# Patient Record
Sex: Female | Born: 1985 | Race: White | Hispanic: No | Marital: Single | Smoking: Former smoker
Health system: Southern US, Community
[De-identification: ages and names within clinical notes are randomized; demographics above are authoritative.]

## PROBLEM LIST (undated history)

## (undated) DIAGNOSIS — J449 Chronic obstructive pulmonary disease, unspecified: Secondary | ICD-10-CM

## (undated) DIAGNOSIS — D649 Anemia, unspecified: Secondary | ICD-10-CM

## (undated) DIAGNOSIS — J4489 Other specified chronic obstructive pulmonary disease: Secondary | ICD-10-CM

## (undated) DIAGNOSIS — F419 Anxiety disorder, unspecified: Secondary | ICD-10-CM

## (undated) HISTORY — PX: TOOTH EXTRACTION: SUR596

---

## 2007-02-14 ENCOUNTER — Ambulatory Visit (HOSPITAL_COMMUNITY): Admission: RE | Admit: 2007-02-14 | Discharge: 2007-02-14 | Payer: Self-pay | Admitting: Obstetrics and Gynecology

## 2007-03-07 ENCOUNTER — Ambulatory Visit (HOSPITAL_COMMUNITY): Admission: RE | Admit: 2007-03-07 | Discharge: 2007-03-07 | Payer: Self-pay | Admitting: Obstetrics and Gynecology

## 2008-12-13 ENCOUNTER — Emergency Department (HOSPITAL_BASED_OUTPATIENT_CLINIC_OR_DEPARTMENT_OTHER): Admission: EM | Admit: 2008-12-13 | Discharge: 2008-12-13 | Payer: Self-pay | Admitting: Emergency Medicine

## 2008-12-27 ENCOUNTER — Emergency Department (HOSPITAL_BASED_OUTPATIENT_CLINIC_OR_DEPARTMENT_OTHER): Admission: EM | Admit: 2008-12-27 | Discharge: 2008-12-27 | Payer: Self-pay | Admitting: Emergency Medicine

## 2009-07-03 ENCOUNTER — Emergency Department (HOSPITAL_BASED_OUTPATIENT_CLINIC_OR_DEPARTMENT_OTHER): Admission: EM | Admit: 2009-07-03 | Discharge: 2009-07-03 | Payer: Self-pay | Admitting: Emergency Medicine

## 2009-07-31 ENCOUNTER — Emergency Department (HOSPITAL_BASED_OUTPATIENT_CLINIC_OR_DEPARTMENT_OTHER): Admission: EM | Admit: 2009-07-31 | Discharge: 2009-07-31 | Payer: Self-pay | Admitting: Emergency Medicine

## 2009-08-04 ENCOUNTER — Emergency Department (HOSPITAL_BASED_OUTPATIENT_CLINIC_OR_DEPARTMENT_OTHER): Admission: EM | Admit: 2009-08-04 | Discharge: 2009-08-04 | Payer: Self-pay | Admitting: Emergency Medicine

## 2009-10-16 ENCOUNTER — Emergency Department (HOSPITAL_BASED_OUTPATIENT_CLINIC_OR_DEPARTMENT_OTHER): Admission: EM | Admit: 2009-10-16 | Discharge: 2009-10-16 | Payer: Self-pay | Admitting: Emergency Medicine

## 2010-07-06 ENCOUNTER — Emergency Department (HOSPITAL_BASED_OUTPATIENT_CLINIC_OR_DEPARTMENT_OTHER): Admission: EM | Admit: 2010-07-06 | Discharge: 2010-07-06 | Payer: Self-pay | Admitting: Emergency Medicine

## 2010-07-06 ENCOUNTER — Ambulatory Visit: Payer: Self-pay | Admitting: Diagnostic Radiology

## 2010-09-29 ENCOUNTER — Emergency Department (HOSPITAL_BASED_OUTPATIENT_CLINIC_OR_DEPARTMENT_OTHER)
Admission: EM | Admit: 2010-09-29 | Discharge: 2010-09-29 | Payer: Self-pay | Source: Home / Self Care | Admitting: Emergency Medicine

## 2010-12-28 LAB — URINALYSIS, ROUTINE W REFLEX MICROSCOPIC
Bilirubin Urine: NEGATIVE
Glucose, UA: NEGATIVE mg/dL
Hgb urine dipstick: NEGATIVE
Specific Gravity, Urine: 1.015 (ref 1.005–1.030)
Urobilinogen, UA: 1 mg/dL (ref 0.0–1.0)
pH: 7.5 (ref 5.0–8.0)

## 2010-12-28 LAB — COMPREHENSIVE METABOLIC PANEL
AST: 27 U/L (ref 0–37)
CO2: 23 mEq/L (ref 19–32)
Calcium: 9 mg/dL (ref 8.4–10.5)
Creatinine, Ser: 0.7 mg/dL (ref 0.4–1.2)
GFR calc Af Amer: >60 mL/min (ref >60)
GFR calc non Af Amer: >60 mL/min (ref >60)
Total Protein: 7.2 g/dL (ref 6.0–8.3)

## 2010-12-28 LAB — URINE MICROSCOPIC-ADD ON

## 2011-01-17 LAB — URINALYSIS, ROUTINE W REFLEX MICROSCOPIC
Nitrite: NEGATIVE
Specific Gravity, Urine: >1.03 — ABNORMAL HIGH (ref 1.005–1.030)
Urobilinogen, UA: 0.2 mg/dL (ref 0.0–1.0)
pH: 6 (ref 5.0–8.0)

## 2011-01-17 LAB — URINE MICROSCOPIC-ADD ON

## 2011-01-17 LAB — PREGNANCY, URINE: Preg Test, Ur: NEGATIVE

## 2011-01-27 LAB — URINALYSIS, ROUTINE W REFLEX MICROSCOPIC
Bilirubin Urine: NEGATIVE
Glucose, UA: NEGATIVE mg/dL
Ketones, ur: NEGATIVE mg/dL
pH: 6.5 (ref 5.0–8.0)

## 2011-01-27 LAB — URINE MICROSCOPIC-ADD ON

## 2011-04-13 ENCOUNTER — Emergency Department (HOSPITAL_COMMUNITY)
Admission: EM | Admit: 2011-04-13 | Discharge: 2011-04-13 | Disposition: A | Discharge status: Home / Self Care | Payer: Self-pay | Attending: Emergency Medicine | Admitting: Emergency Medicine

## 2011-04-13 DIAGNOSIS — F329 Major depressive disorder, single episode, unspecified: Secondary | ICD-10-CM | POA: Insufficient documentation

## 2011-04-13 DIAGNOSIS — F3289 Other specified depressive episodes: Secondary | ICD-10-CM | POA: Insufficient documentation

## 2011-04-13 DIAGNOSIS — F411 Generalized anxiety disorder: Secondary | ICD-10-CM | POA: Insufficient documentation

## 2011-04-13 LAB — DIFFERENTIAL
Basophils Absolute: 0 10*3/uL (ref 0.0–0.1)
Lymphocytes Relative: 19 % (ref 12–46)
Neutro Abs: 8.3 10*3/uL — ABNORMAL HIGH (ref 1.7–7.7)
Neutrophils Relative %: 73 % (ref 43–77)

## 2011-04-13 LAB — CBC
HCT: 41.5 % (ref 36.0–46.0)
Hemoglobin: 14.1 g/dL (ref 12.0–15.0)
RDW: 13.8 % (ref 11.5–15.5)
WBC: 11.4 10*3/uL — ABNORMAL HIGH (ref 4.0–10.5)

## 2011-04-13 LAB — RAPID URINE DRUG SCREEN, HOSP PERFORMED
Amphetamines: POSITIVE — AB
Barbiturates: NOT DETECTED
Benzodiazepines: NOT DETECTED
Tetrahydrocannabinol: POSITIVE — AB

## 2011-04-13 LAB — COMPREHENSIVE METABOLIC PANEL
Albumin: 4.6 g/dL (ref 3.5–5.2)
Alkaline Phosphatase: 64 U/L (ref 39–117)
BUN: 9 mg/dL (ref 6–23)
Chloride: 103 mEq/L (ref 96–112)
Potassium: 3.7 mEq/L (ref 3.5–5.1)
Total Bilirubin: 0.8 mg/dL (ref 0.3–1.2)

## 2011-04-13 LAB — URINALYSIS, ROUTINE W REFLEX MICROSCOPIC
Glucose, UA: NEGATIVE mg/dL
Protein, ur: NEGATIVE mg/dL
Urobilinogen, UA: 1 mg/dL (ref 0.0–1.0)
pH: 7 (ref 5.0–8.0)

## 2011-04-13 LAB — URINE MICROSCOPIC-ADD ON

## 2011-04-13 LAB — ETHANOL: Alcohol, Ethyl (B): <11 mg/dL (ref 0–11)

## 2011-07-20 ENCOUNTER — Encounter: Payer: Self-pay | Admitting: *Deleted

## 2011-07-20 ENCOUNTER — Emergency Department (HOSPITAL_BASED_OUTPATIENT_CLINIC_OR_DEPARTMENT_OTHER)
Admission: EM | Admit: 2011-07-20 | Discharge: 2011-07-20 | Disposition: A | Discharge status: Home / Self Care | Payer: Self-pay | Attending: Emergency Medicine | Admitting: Emergency Medicine

## 2011-07-20 DIAGNOSIS — O2690 Pregnancy related conditions, unspecified, unspecified trimester: Secondary | ICD-10-CM | POA: Insufficient documentation

## 2011-07-20 DIAGNOSIS — O209 Hemorrhage in early pregnancy, unspecified: Secondary | ICD-10-CM

## 2011-07-20 LAB — URINALYSIS, ROUTINE W REFLEX MICROSCOPIC
Ketones, ur: NEGATIVE mg/dL
Nitrite: NEGATIVE
Protein, ur: NEGATIVE mg/dL
Urobilinogen, UA: 0.2 mg/dL (ref 0.0–1.0)

## 2011-07-20 LAB — PREGNANCY, URINE: Preg Test, Ur: POSITIVE

## 2011-07-20 LAB — ABO/RH: ABO/RH(D): A POS

## 2011-07-20 LAB — URINE MICROSCOPIC-ADD ON

## 2011-07-20 LAB — WET PREP, GENITAL: Yeast Wet Prep HPF POC: NONE SEEN

## 2011-07-20 MED ORDER — SODIUM CHLORIDE 0.9 % IV BOLUS (SEPSIS): 1000.0000 mL | Freq: Once | INTRAVENOUS | Status: DC

## 2011-07-20 NOTE — ED Provider Notes (Signed)
Medical screening examination/treatment/procedure(s) were performed by non-physician practitioner and as supervising physician I was immediately available for consultation/collaboration.   Parv Manthey A. Patrica Duel, MD 07/20/11 1453

## 2011-07-20 NOTE — ED Provider Notes (Signed)
History     CSN: [id number] Arrival date & time: 07/20/2011 12:59 PM  Chief Complaint  Patient presents with  . Vaginal Bleeding    (Consider location/radiation/quality/duration/timing/severity/associated sxs/prior treatment) HPI  History reviewed. No pertinent past medical history.  History reviewed. No pertinent past surgical history.  History reviewed. No pertinent family history.  History  Substance Use Topics  . Smoking status: Never Smoker   . Smokeless tobacco: Not on file  . Alcohol Use: No    OB History    Grav Para Term Preterm Abortions TAB SAB Ect Mult Living                  Review of Systems  Allergies  Sulfa antibiotics  Home Medications   Current Outpatient Rx  Name Route Sig Dispense Refill  . PRENATAL 27-0.8 MG PO TABS Oral Take 1 tablet by mouth daily.        BP 127/78  Pulse 74  Temp 98.4 F (36.9 C)  Resp 16  Wt 140 lb (63.504 kg)  SpO2 100%  LMP 06/03/2011  Physical Exam  ED Course  Procedures (including critical care time)  Labs Reviewed  URINALYSIS, ROUTINE W REFLEX MICROSCOPIC - Abnormal; Notable for the following:    Hgb urine dipstick LARGE (*)    Leukocytes, UA SMALL (*)    All other components within normal limits  URINE MICROSCOPIC-ADD ON - Abnormal; Notable for the following:    Bacteria, UA FEW (*)    All other components within normal limits  HCG, QUANTITATIVE, PREGNANCY - Abnormal; Notable for the following:    hCG, Beta Chain, Quant, S 403 (*)    All other components within normal limits  WET PREP, GENITAL - Abnormal; Notable for the following:    Clue Cells, Wet Prep FEW (*)    WBC, Wet Prep HPF POC FEW (*)    All other components within normal limits  PREGNANCY, URINE  ABO/RH  GC/CHLAMYDIA PROBE AMP, GENITAL   No results found.   No diagnosis found.    MDM  Note by Trudie Reed, PA 07/21/11 1455

## 2011-07-20 NOTE — ED Notes (Signed)
Cordelia Pen, PA-C at bedside.

## 2011-07-20 NOTE — ED Provider Notes (Addendum)
History     CSN: [id number] Arrival date & time: 07/20/2011 12:59 PM  Chief Complaint  Patient presents with  . Vaginal Bleeding    (Consider location/radiation/quality/duration/timing/severity/associated sxs/prior treatment) Patient is a 25 y.o. female presenting with vaginal bleeding. The history is provided by the patient.  Vaginal Bleeding This is a new problem. The current episode started yesterday. The problem has been gradually worsening. Pertinent negatives include no abdominal pain, change in bowel habit, chills, fever, nausea, urinary symptoms or vomiting. Associated symptoms comments: No vaginal discharge. She reports finding out she is pregnant last week when evaluated by her GYN. She had an IUD in place and is unaware of it having fallen out, but reports on GYN exam, including ultrasound, that the IUD was not found. . The symptoms are aggravated by nothing. She has tried nothing for the symptoms.    History reviewed. No pertinent past medical history.  History reviewed. No pertinent past surgical history.  History reviewed. No pertinent family history.  History  Substance Use Topics  . Smoking status: Never Smoker   . Smokeless tobacco: Not on file  . Alcohol Use: No    OB History    Grav Para Term Preterm Abortions TAB SAB Ect Mult Living                  Review of Systems  Constitutional: Negative for fever and chills.  Gastrointestinal: Negative for nausea, vomiting, abdominal pain and change in bowel habit.  Genitourinary: Positive for vaginal bleeding. Negative for dysuria and vaginal discharge.    Allergies  Sulfa antibiotics  Home Medications   Current Outpatient Rx  Name Route Sig Dispense Refill  . PRENATAL 27-0.8 MG PO TABS Oral Take 1 tablet by mouth daily.        BP 127/78  Pulse 74  Temp 98.4 F (36.9 C)  Resp 16  Wt 140 lb (63.504 kg)  SpO2 100%  LMP 06/03/2011  Physical Exam  Constitutional: She appears well-developed and  well-nourished.  Cardiovascular: Normal rate and regular rhythm.   Pulmonary/Chest: Effort normal and breath sounds normal.  Abdominal: Soft. Bowel sounds are normal.  Genitourinary: Vagina normal and uterus normal. Cervix exhibits no motion tenderness and no discharge. Right adnexum displays no tenderness and no fullness. Left adnexum displays no tenderness and no fullness.       Minimal cervical bleeding.     ED Course  Procedures (including critical care time)  Labs Reviewed  URINALYSIS, ROUTINE W REFLEX MICROSCOPIC - Abnormal; Notable for the following:    Hgb urine dipstick LARGE (*)    Leukocytes, UA SMALL (*)    All other components within normal limits  URINE MICROSCOPIC-ADD ON - Abnormal; Notable for the following:    Bacteria, UA FEW (*)    All other components within normal limits  PREGNANCY, URINE  ABO/RH  HCG, QUANTITATIVE, PREGNANCY   No results found.   No diagnosis found.    MDM     Results for orders placed during the hospital encounter of 07/20/11  URINALYSIS, ROUTINE W REFLEX MICROSCOPIC      Component Value Range   Color, Urine YELLOW  YELLOW    Appearance CLEAR  CLEAR    Specific Gravity, Urine 1.024  1.005 - 1.030    pH 6.0  5.0 - 8.0    Glucose, UA NEGATIVE  NEGATIVE (mg/dL)   Hgb urine dipstick LARGE (*) NEGATIVE    Bilirubin Urine NEGATIVE  NEGATIVE    Ketones, ur  NEGATIVE  NEGATIVE (mg/dL)   Protein, ur NEGATIVE  NEGATIVE (mg/dL)   Urobilinogen, UA 0.2  0.0 - 1.0 (mg/dL)   Nitrite NEGATIVE  NEGATIVE    Leukocytes, UA SMALL (*) NEGATIVE   PREGNANCY, URINE      Component Value Range   Preg Test, Ur POSITIVE    URINE MICROSCOPIC-ADD ON      Component Value Range   Squamous Epithelial / LPF RARE  RARE    WBC, UA 3-6  <3 (WBC/hpf)   RBC / HPF 11-20  <3 (RBC/hpf)   Bacteria, UA FEW (*) RARE    No results found.          Rodena Medin, PA 07/20/11 1356  Rodena Medin, PA 07/20/11 [phone]

## 2011-07-20 NOTE — ED Notes (Signed)
Pt states x2 days ago DX preg 7 weeks with IUD in place at [facility] office ,  Vaginal bleeding  x 2 days, denies cramping

## 2011-07-20 NOTE — ED Notes (Signed)
Correction to prior entry- peripheral IV not actually done.  Error in documentation.

## 2011-07-20 NOTE — ED Provider Notes (Signed)
Medical screening examination/treatment/procedure(s) were performed by non-physician practitioner and as supervising physician I was immediately available for consultation/collaboration.   Allexis Bordenave A. Patrica Duel, MD 07/20/11 1413

## 2011-07-21 LAB — GC/CHLAMYDIA PROBE AMP, GENITAL
Chlamydia, DNA Probe: NEGATIVE
GC Probe Amp, Genital: NEGATIVE

## 2011-07-22 ENCOUNTER — Inpatient Hospital Stay (HOSPITAL_COMMUNITY)
Admission: AD | Admit: 2011-07-22 | Discharge: 2011-07-22 | Disposition: A | Discharge status: Home / Self Care | Payer: Self-pay | Source: Ambulatory Visit | Attending: Obstetrics & Gynecology | Admitting: Obstetrics & Gynecology

## 2011-07-22 ENCOUNTER — Encounter (HOSPITAL_COMMUNITY): Payer: Self-pay | Admitting: *Deleted

## 2011-07-22 DIAGNOSIS — O039 Complete or unspecified spontaneous abortion without complication: Secondary | ICD-10-CM | POA: Insufficient documentation

## 2011-07-22 HISTORY — DX: Anxiety disorder, unspecified: F41.9

## 2011-07-22 HISTORY — DX: Anemia, unspecified: D64.9

## 2011-07-22 NOTE — Progress Notes (Signed)
Seen at [facility] med center on 10/03 - for bleeding in preg.  Had an IUD placed 3 yrs ago, was not aware it had come out. Instructed to follow up here.  BHCG around 400.  Passed a large clot.

## 2011-07-22 NOTE — ED Provider Notes (Signed)
History     Chief Complaint  Patient presents with  . Vaginal Bleeding   HPI This is a 25 year old G2 P1 001 who presents to the MAU for followup. The patient was seen in her obstetrician's office 2 weeks ago and had a positive pregnancy test. Ultrasound that was done at that time showed no IUD which was a surprise to her. There is no gestational sac visualized per the patient. The patient was seen at [facility] 2 days ago for abdominal cramping and had a beta hCG quantitative level of 400. She was instructed to followup at the MAU in 2 days. The patient had more abdominal cramping yesterday and today and passed a blood clot approximately 4 cm in diameter earlier this morning that contained a whitish substance.  She has resolution of her cramping after she passed this clot.  She denies fevers, chills, abdominal pain, vaginal discharge.  No past medical history on file.  No past surgical history on file.  No family history on file.  History  Substance Use Topics  . Smoking status: Never Smoker   . Smokeless tobacco: Not on file  . Alcohol Use: No    Allergies:  Allergies  Allergen Reactions  . Sulfa Antibiotics Hives    Prescriptions prior to admission  Medication Sig Dispense Refill  . Prenatal Vit-Fe Fumarate-FA (MULTIVITAMIN-PRENATAL) 27-0.8 MG TABS Take 1 tablet by mouth daily.          Review of Systems  All other systems reviewed and are negative.   Physical Exam   Blood pressure 119/69, pulse 76, temperature 98.7 F (37.1 C), temperature source Oral, resp. rate 18, height 5\' 5"  (1.651 m), weight 65.772 kg (145 lb), last menstrual period 06/03/2011.  Physical Exam  Constitutional: She appears well-developed and well-nourished.  GI: Soft. Bowel sounds are normal. She exhibits no distension and no mass. There is no tenderness. There is no rebound and no guarding.   Beta hCG Quant: 480 MAU Course  Procedures   Assessment and Plan  #1 spontaneous  abortion  Ultrasound was offered to the patient but she declined and preferred the a serum hCG Quant level drawn. The patient left prior to receiving the results of the beta-hCG. I called the patient to discuss the results of the beta-hCG level. I recommended to the patient that she return to [facility] in 48 hours to have another Quant level drawn. The patient acknowledged and demonstrated understanding. I also encouraged the patient to return if she developed increasing abdominal pain, increased vaginal bleeding, fever, and the development of any other symptoms.  STINSON, JACOB JEHIEL 07/22/2011, 12:06 PM

## 2011-07-22 NOTE — ED Notes (Signed)
Pt 's Bhcg is slightly elevated today; Dr Adrian Blackwater called and notified pt and she is to follow-up in 2 days;

## 2011-08-10 NOTE — ED Provider Notes (Signed)
Medical screening examination/treatment/procedure(s) were performed by non-physician practitioner and as supervising physician I was immediately available for consultation/collaboration.   Keighan Amezcua A. Patrica Duel, MD 08/10/11 [phone]

## 2013-10-14 ENCOUNTER — Emergency Department (HOSPITAL_BASED_OUTPATIENT_CLINIC_OR_DEPARTMENT_OTHER): Payer: Self-pay

## 2013-10-14 ENCOUNTER — Encounter (HOSPITAL_BASED_OUTPATIENT_CLINIC_OR_DEPARTMENT_OTHER): Payer: Self-pay | Admitting: Emergency Medicine

## 2013-10-14 DIAGNOSIS — Z87891 Personal history of nicotine dependence: Secondary | ICD-10-CM | POA: Insufficient documentation

## 2013-10-14 DIAGNOSIS — Z79899 Other long term (current) drug therapy: Secondary | ICD-10-CM | POA: Insufficient documentation

## 2013-10-14 DIAGNOSIS — J449 Chronic obstructive pulmonary disease, unspecified: Secondary | ICD-10-CM | POA: Insufficient documentation

## 2013-10-14 DIAGNOSIS — J4489 Other specified chronic obstructive pulmonary disease: Secondary | ICD-10-CM | POA: Insufficient documentation

## 2013-10-14 DIAGNOSIS — D649 Anemia, unspecified: Secondary | ICD-10-CM | POA: Insufficient documentation

## 2013-10-14 NOTE — ED Notes (Signed)
Head cold sx started 2 weeks ago.  Productive cough x1 week.  Pt sts she is afraid it has turned to Bronchitis because "it usually does once a year."

## 2013-10-15 ENCOUNTER — Emergency Department (HOSPITAL_BASED_OUTPATIENT_CLINIC_OR_DEPARTMENT_OTHER)
Admission: EM | Admit: 2013-10-15 | Discharge: 2013-10-15 | Disposition: A | Discharge status: Home / Self Care | Payer: Self-pay | Attending: Emergency Medicine | Admitting: Emergency Medicine

## 2013-10-15 DIAGNOSIS — J209 Acute bronchitis, unspecified: Secondary | ICD-10-CM

## 2013-10-15 HISTORY — DX: Chronic obstructive pulmonary disease, unspecified: J44.9

## 2013-10-15 HISTORY — DX: Other specified chronic obstructive pulmonary disease: J44.89

## 2013-10-15 MED ORDER — HYDROCOD POLST-CHLORPHEN POLST 10-8 MG/5ML PO LQCR: 5.0000 mL | Freq: Two times a day (BID) | ORAL | Status: AC | PRN

## 2013-10-15 MED ORDER — ALBUTEROL SULFATE HFA 108 (90 BASE) MCG/ACT IN AERS
2.0000 | INHALATION_SPRAY | RESPIRATORY_TRACT | Status: DC | PRN
  Administered 2013-10-15: 2 via RESPIRATORY_TRACT
  Filled 2013-10-15: qty 6.7

## 2013-10-15 MED ORDER — HYDROCOD POLST-CHLORPHEN POLST 10-8 MG/5ML PO LQCR
5.0000 mL | Freq: Once | ORAL | Status: AC
  Administered 2013-10-15: 5 mL via ORAL
  Filled 2013-10-15: qty 5

## 2013-10-15 MED ORDER — AZITHROMYCIN 250 MG PO TABS: ORAL_TABLET | ORAL | Status: AC

## 2013-10-15 NOTE — Patient Instructions (Signed)
Instrucyed patient on the proper use of administering albuterol mdi via aerochamber patient tolerated well

## 2013-10-15 NOTE — ED Provider Notes (Signed)
CSN: [id number]     Arrival date & time 10/14/13  2332 History   First MD Initiated Contact with Patient 10/15/13 0206     Chief Complaint  Patient presents with  . Cough   (Consider location/radiation/quality/duration/timing/severity/associated sxs/prior Treatment) HPI This is a 27 year old female with a two-week history of cold. Her symptoms were nasal congestion, scratchy throat and ear pressure. The symptoms have improved but she now has a persistent cough for the past week. Coughing is exacerbated by taking a deep breath. She feels short of breath with exertion. The cough has occasionally been productive. She denies fever but has had chills. She denies nausea, vomiting or diarrhea.  Past Medical History  Diagnosis Date  . Anemia   . Chronic asthmatic bronchitis   . Anemia    Past Surgical History  Procedure Laterality Date  . Tooth extraction     No family history on file. History  Substance Use Topics  . Smoking status: Former Games developer  . Smokeless tobacco: Not on file  . Alcohol Use: Yes     Comment: occ   OB History   Grav Para Term Preterm Abortions TAB SAB Ect Mult Living   2 1 1       1      Review of Systems  All other systems reviewed and are negative.    Allergies  Sulfa antibiotics  Home Medications   Current Outpatient Rx  Name  Route  Sig  Dispense  Refill  . cyclobenzaprine (FLEXERIL) 5 MG tablet   Oral   Take 5 mg by mouth 3 (three) times daily as needed. For back pain.          . Prenatal Vit-Fe Fumarate-FA (MULTIVITAMIN-PRENATAL) 27-0.8 MG TABS   Oral   Take 1 tablet by mouth daily.            BP 127/87  Pulse 101  Temp(Src) 99 F (37.2 C) (Oral)  Resp 20  Ht 5\' 5"  (1.651 m)  Wt 150 lb (68.04 kg)  BMI 24.96 kg/m2  SpO2 97%  LMP 09/24/2013  Breastfeeding? Unknown  Physical Exam General: Well-developed, well-nourished female in no acute distress; appearance consistent with age of record HENT: normocephalic; atraumatic; TMs  normal; no pharyngeal erythema or exudate Eyes: pupils equal, round and reactive to light; extraocular muscles intact Neck: supple Heart: regular rate and rhythm; no murmurs, rubs or gallops Lungs: clear to auscultation bilaterally; shallow breaths; frequent dry cough Abdomen: soft; nondistended; nontender; no masses or hepatosplenomegaly; bowel sounds present Extremities: No deformity; full range of motion Neurologic: Awake, alert and oriented; motor function intact in all extremities and symmetric; no facial droop Skin: Warm and dry Psychiatric: Normal mood and affect    ED Course  Procedures (including critical care time)    MDM  Nursing notes and vitals signs, including pulse oximetry, reviewed.  Summary of this visit's results, reviewed by myself:  Labs:  No results found for this or any previous visit (from the past 24 hour(s)).  Imaging Studies: Dg Chest 2 View  10/15/2013   CLINICAL DATA:  Cough, congestion, fever  EXAM: CHEST  2 VIEW  COMPARISON:  07/08/2010  FINDINGS: The heart size and mediastinal contours are within normal limits. Both lungs are clear. The visualized skeletal structures are unremarkable.  IMPRESSION: No active cardiopulmonary disease.   Electronically Signed   By: Elige Ko   On: 10/15/2013 00:18        Hanley Seamen, MD 10/15/13 [phone]

## 2014-06-27 ENCOUNTER — Encounter (HOSPITAL_BASED_OUTPATIENT_CLINIC_OR_DEPARTMENT_OTHER): Payer: Self-pay | Admitting: Emergency Medicine

## 2014-06-27 ENCOUNTER — Emergency Department (HOSPITAL_BASED_OUTPATIENT_CLINIC_OR_DEPARTMENT_OTHER)
Admission: EM | Admit: 2014-06-27 | Discharge: 2014-06-27 | Disposition: A | Discharge status: Home / Self Care | Payer: Self-pay | Attending: Emergency Medicine | Admitting: Emergency Medicine

## 2014-06-27 DIAGNOSIS — J069 Acute upper respiratory infection, unspecified: Secondary | ICD-10-CM | POA: Insufficient documentation

## 2014-06-27 DIAGNOSIS — Z792 Long term (current) use of antibiotics: Secondary | ICD-10-CM | POA: Insufficient documentation

## 2014-06-27 DIAGNOSIS — R05 Cough: Secondary | ICD-10-CM | POA: Insufficient documentation

## 2014-06-27 DIAGNOSIS — H65199 Other acute nonsuppurative otitis media, unspecified ear: Secondary | ICD-10-CM | POA: Insufficient documentation

## 2014-06-27 DIAGNOSIS — Z79899 Other long term (current) drug therapy: Secondary | ICD-10-CM | POA: Insufficient documentation

## 2014-06-27 DIAGNOSIS — Z862 Personal history of diseases of the blood and blood-forming organs and certain disorders involving the immune mechanism: Secondary | ICD-10-CM | POA: Insufficient documentation

## 2014-06-27 DIAGNOSIS — R059 Cough, unspecified: Secondary | ICD-10-CM | POA: Insufficient documentation

## 2014-06-27 DIAGNOSIS — Z87891 Personal history of nicotine dependence: Secondary | ICD-10-CM | POA: Insufficient documentation

## 2014-06-27 DIAGNOSIS — H65191 Other acute nonsuppurative otitis media, right ear: Secondary | ICD-10-CM

## 2014-06-27 DIAGNOSIS — J45909 Unspecified asthma, uncomplicated: Secondary | ICD-10-CM | POA: Insufficient documentation

## 2014-06-27 MED ORDER — CETIRIZINE-PSEUDOEPHEDRINE ER 5-120 MG PO TB12: 1.0000 | ORAL_TABLET | Freq: Every day | ORAL | Status: AC

## 2014-06-27 MED ORDER — AMOXICILLIN 500 MG PO CAPS: 500.0000 mg | ORAL_CAPSULE | Freq: Three times a day (TID) | ORAL | Status: AC

## 2014-06-27 NOTE — ED Provider Notes (Signed)
CSN: [id number]     Arrival date & time 06/27/14  1635 History   First MD Initiated Contact with Patient 06/27/14 1759     Chief Complaint  Patient presents with  . URI     (Consider location/radiation/quality/duration/timing/severity/associated sxs/prior Treatment) Patient is a 28 y.o. female presenting with URI. The history is provided by the patient. No language interpreter was used.  URI Presenting symptoms: congestion, cough and ear pain   Severity:  Moderate Onset quality:  Gradual Timing:  Constant Progression:  Worsening Chronicity:  New Relieved by:  Nothing Worsened by:  Nothing tried Ineffective treatments:  None tried Risk factors: no recent illness   Pt reports hears p[opping in ears,  Decreased hearing both ears.   No fever  Coughing and congestion  Past Medical History  Diagnosis Date  . Anemia   . Chronic asthmatic bronchitis   . Anemia    Past Surgical History  Procedure Laterality Date  . Tooth extraction     No family history on file. History  Substance Use Topics  . Smoking status: Former Games developer  . Smokeless tobacco: Not on file  . Alcohol Use: Yes     Comment: occ   OB History   Grav Para Term Preterm Abortions TAB SAB Ect Mult Living   Review of Systems  HENT: Positive for congestion and ear pain.   Respiratory: Positive for cough.   All other systems reviewed and are negative.     Allergies  Sulfa antibiotics  Home Medications   Prior to Admission medications   Medication Sig Start Date End Date Taking? Authorizing Provider  amoxicillin (AMOXIL) 500 MG capsule Take 1 capsule (500 mg total) by mouth 3 (three) times daily. 06/27/14 07/07/14  Elson Areas, PA-C  azithromycin (ZITHROMAX Z-PAK) 250 MG tablet 2 po day one, then 1 daily x 4 days 10/15/13   Carlisle Beers Molpus, MD  cetirizine-pseudoephedrine (ZYRTEC-D) 5-120 MG per tablet Take 1 tablet by mouth daily. 06/27/14   Elson Areas, PA-C   chlorpheniramine-HYDROcodone [facility]) 10-8 MG/5ML LQCR Take 5 mLs by mouth every 12 (twelve) hours as needed for cough. 10/15/13   John L Molpus, MD  cyclobenzaprine (FLEXERIL) 5 MG tablet Take 5 mg by mouth 3 (three) times daily as needed. For back pain.     Historical Provider, MD  Prenatal Vit-Fe Fumarate-FA (MULTIVITAMIN-PRENATAL) 27-0.8 MG TABS Take 1 tablet by mouth daily.      Historical Provider, MD   BP 127/78  Pulse 79  Temp(Src) 98.3 F (36.8 C) (Oral)  Resp 18  Wt 150 lb (68.04 kg)  SpO2 100%  LMP 06/03/2014 Physical Exam  Nursing note and vitals reviewed. Constitutional: She is oriented to person, place, and time. She appears well-developed and well-nourished.  HENT:  Head: Normocephalic.  Right Ear: External ear normal.  Left Ear: External ear normal.  Mouth/Throat: Oropharynx is clear and moist.  Left tm erythematous  Eyes: EOM are normal. Pupils are equal, round, and reactive to light.  Neck: Normal range of motion.  Cardiovascular: Normal rate.   Pulmonary/Chest: Effort normal.  Abdominal: She exhibits no distension.  Musculoskeletal: Normal range of motion.  Neurological: She is alert and oriented to person, place, and time.  Psychiatric: She has a normal mood and affect.    ED Course  Procedures (including critical care time) Labs Review Labs Reviewed - No data to display  Imaging  Review No results found.   EKG Interpretation None      MDM   Final diagnoses:  Acute nonsuppurative otitis media of right ear  URI (upper respiratory infection)    amoxicillian Zyrtec Follow up with ENT if hearing does not improve    Elson Areas, PA-C 06/27/14 1850

## 2014-06-27 NOTE — ED Notes (Signed)
PA at bedside.

## 2014-06-27 NOTE — Discharge Instructions (Signed)

## 2014-06-27 NOTE — ED Notes (Signed)
Ears are stopped up, aching all over.

## 2014-06-27 NOTE — ED Provider Notes (Signed)
Medical screening examination/treatment/procedure(s) were performed by non-physician practitioner and as supervising physician I was immediately available for consultation/collaboration.    Kalese Ensz, MD 06/27/14 2344 

## 2014-08-18 ENCOUNTER — Encounter (HOSPITAL_BASED_OUTPATIENT_CLINIC_OR_DEPARTMENT_OTHER): Payer: Self-pay | Admitting: Emergency Medicine

## 2014-11-22 ENCOUNTER — Emergency Department (HOSPITAL_BASED_OUTPATIENT_CLINIC_OR_DEPARTMENT_OTHER)
Admission: EM | Admit: 2014-11-22 | Discharge: 2014-11-22 | Disposition: A | Discharge status: Home / Self Care | Payer: Self-pay | Attending: Emergency Medicine | Admitting: Emergency Medicine

## 2014-11-22 ENCOUNTER — Encounter (HOSPITAL_BASED_OUTPATIENT_CLINIC_OR_DEPARTMENT_OTHER): Payer: Self-pay | Admitting: *Deleted

## 2014-11-22 DIAGNOSIS — Z79899 Other long term (current) drug therapy: Secondary | ICD-10-CM | POA: Insufficient documentation

## 2014-11-22 DIAGNOSIS — D649 Anemia, unspecified: Secondary | ICD-10-CM | POA: Insufficient documentation

## 2014-11-22 DIAGNOSIS — J45909 Unspecified asthma, uncomplicated: Secondary | ICD-10-CM | POA: Insufficient documentation

## 2014-11-22 DIAGNOSIS — J029 Acute pharyngitis, unspecified: Secondary | ICD-10-CM

## 2014-11-22 DIAGNOSIS — B349 Viral infection, unspecified: Secondary | ICD-10-CM | POA: Insufficient documentation

## 2014-11-22 DIAGNOSIS — G5602 Carpal tunnel syndrome, left upper limb: Secondary | ICD-10-CM | POA: Insufficient documentation

## 2014-11-22 DIAGNOSIS — G5603 Carpal tunnel syndrome, bilateral upper limbs: Secondary | ICD-10-CM

## 2014-11-22 DIAGNOSIS — G5601 Carpal tunnel syndrome, right upper limb: Secondary | ICD-10-CM | POA: Insufficient documentation

## 2014-11-22 DIAGNOSIS — Z87891 Personal history of nicotine dependence: Secondary | ICD-10-CM | POA: Insufficient documentation

## 2014-11-22 LAB — RAPID STREP SCREEN (MED CTR MEBANE ONLY): Streptococcus, Group A Screen (Direct): NEGATIVE

## 2014-11-22 NOTE — ED Notes (Signed)
Pt having a sore throat and increasing fatigue. She also wants to mention that her hands go numb sometimes and she has to lower them to make the numbness go away.
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
## 2014-11-22 NOTE — Discharge Instructions (Signed)
Please call your doctor for a followup appointment within 24-48 hours. When you talk to your doctor please let them know that you were seen in the emergency department and have them acquire all of your records so that they can discuss the findings with you and formulate a treatment plan to fully care for your new and ongoing problems. Please follow-up with health and wellness Center Please follow-up with hand specialist Please keep wrist brace on at night when resting for comfort purposes for carpal tunnel syndrome Please rest and stay hydrated Please drink plenty of water and stay hydrated Please rest the voice Please continue to monitor symptoms closely and if symptoms are to worsen or change (fever greater than 101, chills, sweating, nausea, vomiting, chest pain, shortness of breathe, difficulty breathing, weakness, numbness, tingling, worsening or changes to pain pattern, Neck swelling, inability to open close the jaw, inability to swallow, visual changes, worst headache, changes to skin colored and hands, fall, injury, neck pain, neck stiffness) please report back to the Emergency Department immediately.   Upper Respiratory Infection, Adult An upper respiratory infection (URI) is also sometimes known as the common cold. The upper respiratory tract includes the nose, sinuses, throat, trachea, and bronchi. Bronchi are the airways leading to the lungs. Most people improve within 1 week, but symptoms can last up to 2 weeks. A residual cough may last even longer.  CAUSES Many different viruses can infect the tissues lining the upper respiratory tract. The tissues become irritated and inflamed and often become very moist. Mucus production is also common. A cold is contagious. You can easily spread the virus to others by oral contact. This includes kissing, sharing a glass, coughing, or sneezing. Touching your mouth or nose and then touching a surface, which is then touched by another person, can also  spread the virus. SYMPTOMS  Symptoms typically develop 1 to 3 days after you come in contact with a cold virus. Symptoms vary from person to person. They may include:  Runny nose.  Sneezing.  Nasal congestion.  Sinus irritation.  Sore throat.  Loss of voice (laryngitis).  Cough.  Fatigue.  Muscle aches.  Loss of appetite.  Headache.  Low-grade fever. DIAGNOSIS  You might diagnose your own cold based on familiar symptoms, since most people get a cold 2 to 3 times a year. Your caregiver can confirm this based on your exam. Most importantly, your caregiver can check that your symptoms are not due to another disease such as strep throat, sinusitis, pneumonia, asthma, or epiglottitis. Blood tests, throat tests, and X-rays are not necessary to diagnose a common cold, but they may sometimes be helpful in excluding other more serious diseases. Your caregiver will decide if any further tests are required. RISKS AND COMPLICATIONS  You may be at risk for a more severe case of the common cold if you smoke cigarettes, have chronic heart disease (such as heart failure) or lung disease (such as asthma), or if you have a weakened immune system. The very young and very old are also at risk for more serious infections. Bacterial sinusitis, middle ear infections, and bacterial pneumonia can complicate the common cold. The common cold can worsen asthma and chronic obstructive pulmonary disease (COPD). Sometimes, these complications can require emergency medical care and may be life-threatening. PREVENTION  The best way to protect against getting a cold is to practice good hygiene. Avoid oral or hand contact with people with cold symptoms. Wash your hands often if contact occurs. There is  no clear evidence that vitamin C, vitamin E, echinacea, or exercise reduces the chance of developing a cold. However, it is always recommended to get plenty of rest and practice good nutrition. TREATMENT  Treatment is  directed at relieving symptoms. There is no cure. Antibiotics are not effective, because the infection is caused by a virus, not by bacteria. Treatment may include:  Increased fluid intake. Sports drinks offer valuable electrolytes, sugars, and fluids.  Breathing heated mist or steam (vaporizer or shower).  Eating chicken soup or other clear broths, and maintaining good nutrition.  Getting plenty of rest.  Using gargles or lozenges for comfort.  Controlling fevers with ibuprofen or acetaminophen as directed by your caregiver.  Increasing usage of your inhaler if you have asthma. Zinc gel and zinc lozenges, taken in the first 24 hours of the common cold, can shorten the duration and lessen the severity of symptoms. Pain medicines may help with fever, muscle aches, and throat pain. A variety of non-prescription medicines are available to treat congestion and runny nose. Your caregiver can make recommendations and may suggest nasal or lung inhalers for other symptoms.  HOME CARE INSTRUCTIONS   Only take over-the-counter or prescription medicines for pain, discomfort, or fever as directed by your caregiver.  Use a warm mist humidifier or inhale steam from a shower to increase air moisture. This may keep secretions moist and make it easier to breathe.  Drink enough water and fluids to keep your urine clear or pale yellow.  Rest as needed.  Return to work when your temperature has returned to normal or as your caregiver advises. You may need to stay home longer to avoid infecting others. You can also use a face mask and careful hand washing to prevent spread of the virus. SEEK MEDICAL CARE IF:   After the first few days, you feel you are getting worse rather than better.  You need your caregiver's advice about medicines to control symptoms.  You develop chills, worsening shortness of breath, or brown or red sputum. These may be signs of pneumonia.  You develop yellow or brown nasal  discharge or pain in the face, especially when you bend forward. These may be signs of sinusitis.  You develop a fever, swollen neck glands, pain with swallowing, or white areas in the back of your throat. These may be signs of strep throat. SEEK IMMEDIATE MEDICAL CARE IF:   You have a fever.  You develop severe or persistent headache, ear pain, sinus pain, or chest pain.  You develop wheezing, a prolonged cough, cough up blood, or have a change in your usual mucus (if you have chronic lung disease).  You develop sore muscles or a stiff neck. Document Released: 03/29/2001 Document Revised: 12/26/2011 Document Reviewed: 01/08/2014 [facility] Patient Information 2015 [place], [place]. This information is not intended to replace advice given to you by your health care provider. Make sure you discuss any questions you have with your health care provider.  Viral Infections A virus is a type of germ. Viruses can cause:  Minor sore throats.  Aches and pains.  Headaches.  Runny nose.  Rashes.  Watery eyes.  Tiredness.  Coughs.  Loss of appetite.  Feeling sick to your stomach (nausea).  Throwing up (vomiting).  Watery poop (diarrhea). HOME CARE   Only take medicines as told by your doctor.  Drink enough water and fluids to keep your pee (urine) clear or pale yellow. Sports drinks are a good choice.  Get plenty of  rest and eat healthy. Soups and broths with crackers or rice are fine. GET HELP RIGHT AWAY IF:   You have a very bad headache.  You have shortness of breath.  You have chest pain or neck pain.  You have an unusual rash.  You cannot stop throwing up.  You have watery poop that does not stop.  You cannot keep fluids down.  You or your child has a temperature by mouth above 102 F (38.9 C), not controlled by medicine.  Your baby is older than 3 months with a rectal temperature of 102 F (38.9 C) or higher.  Your baby is 4 months old or younger with a  rectal temperature of 100.4 F (38 C) or higher. MAKE SURE YOU:   Understand these instructions.  Will watch this condition.  Will get help right away if you are not doing well or get worse. Document Released: 09/15/2008 Document Revised: 12/26/2011 Document Reviewed: 02/08/2011 [facility] Patient Information 2015 [place], [place]. This information is not intended to replace advice given to you by your health care provider. Make sure you discuss any questions you have with your health care provider.  Carpal Tunnel Syndrome The carpal tunnel is a narrow area located on the palm side of your wrist. The tunnel is formed by the wrist bones and ligaments. Nerves, blood vessels, and tendons pass through the carpal tunnel. Repeated wrist motion or certain diseases may cause swelling within the tunnel. This swelling pinches the main nerve in the wrist (median nerve) and causes the painful hand and arm condition called carpal tunnel syndrome. CAUSES   Repeated wrist motions.  Wrist injuries.  Certain diseases like arthritis, diabetes, alcoholism, hyperthyroidism, and kidney failure.  Obesity.  Pregnancy. SYMPTOMS   A "pins and needles" feeling in your fingers or hand, especially in your thumb, index and middle fingers.  Tingling or numbness in your fingers or hand.  An aching feeling in your entire arm, especially when your wrist and elbow are bent for long periods of time.  Wrist pain that goes up your arm to your shoulder.  Pain that goes down into your palm or fingers.  A weak feeling in your hands. DIAGNOSIS  Your health care provider will take your history and perform a physical exam. An electromyography test may be needed. This test measures electrical signals sent out by your nerves into the muscles. The electrical signals are usually slowed by carpal tunnel syndrome. You may also need X-rays. TREATMENT  Carpal tunnel syndrome may clear up by itself. Your health care provider may  recommend a wrist splint or medicine such as a nonsteroidal anti-inflammatory medicine. Cortisone injections may help. Sometimes, surgery may be needed to free the pinched nerve.  HOME CARE INSTRUCTIONS   Take all medicine as directed by your health care provider. Only take over-the-counter or prescription medicines for pain, discomfort, or fever as directed by your health care provider.  If you were given a splint to keep your wrist from bending, wear it as directed. It is important to wear the splint at night. Wear the splint for as long as you have pain or numbness in your hand, arm, or wrist. This may take 1 to 2 months.  Rest your wrist from any activity that may be causing your pain. If your symptoms are work-related, you may need to talk to your employer about changing to a job that does not require using your wrist.  Put ice on your wrist after long periods of wrist  activity.  Put ice in a plastic bag.  Place a towel between your skin and the bag.  Leave the ice on for 15-20 minutes, 03-04 times a day.  Keep all follow-up visits as directed by your health care provider. This includes any orthopedic referrals, physical therapy, and rehabilitation. Any delay in getting necessary care could result in a delay or failure of your condition to heal. SEEK IMMEDIATE MEDICAL CARE IF:   You have new, unexplained symptoms.  Your symptoms get worse and are not helped or controlled with medicines. MAKE SURE YOU:   Understand these instructions.  Will watch your condition.  Will get help right away if you are not doing well or get worse. Document Released: 09/30/2000 Document Revised: 02/17/2014 Document Reviewed: 08/19/2011 [facility] Patient Information 2015 [place], [place]. This information is not intended to replace advice given to you by your health care provider. Make sure you discuss any questions you have with your health care provider.   Emergency Department Resource Guide 1) Find  a Doctor and Pay Out of Pocket Although you won't have to find out who is covered by your insurance plan, it is a good idea to ask around and get recommendations. You will then need to call the office and see if the doctor you have chosen will accept you as a new patient and what types of options they offer for patients who are self-pay. Some doctors offer discounts or will set up payment plans for their patients who do not have insurance, but you will need to ask so you aren't surprised when you get to your appointment.  2) Contact Your Local Health Department Not all health departments have doctors that can see patients for sick visits, but many do, so it is worth a call to see if yours does. If you don't know where your local health department is, you can check in your phone book. The CDC also has a tool to help you locate your state's health department, and many state websites also have listings of all of their local health departments.  3) Find a Walk-in Clinic If your illness is not likely to be very severe or complicated, you may want to try a walk in clinic. These are popping up all over the country in pharmacies, drugstores, and shopping centers. They're usually staffed by nurse practitioners or physician assistants that have been trained to treat common illnesses and complaints. They're usually fairly quick and inexpensive. However, if you have serious medical issues or chronic medical problems, these are probably not your best option.  No Primary Care Doctor: - Call Health Connect at  [phone] - they can help you locate a primary care doctor that  accepts your insurance, provides certain services, etc. - Physician Referral Service- [phone]  Chronic Pain Problems: Organization         Address  Phone   Notes  [facility]  [phone] Patients need to be referred by their primary care doctor.   Medication Assistance: Organization         Address  Phone    Notes  [facility] Medication [facility] [address] [place] [address] [place], [place] [address] [phone] --Must be a resident of [facility] -- Must have NO insurance coverage whatsoever (no Medicaid/ Medicare, etc.) -- The pt. MUST have a primary care doctor that directs their care regularly and follows them in the community   [facility]ist  [phone]   [employer]  [phone])  [phone]    Agencies that provide inexpensive medical care: Organization         Address  Phone   Notes  [facility]  [phone]   [facility]    [phone]   [facility] [address] [place], [place] [address] [phone]   [facility] [address] [place], [place] [phone]   Planned Parenthood    [phone]   [facility]    [phone]   [facility]  [address], [place] Phone:  [phone], Fax:  [phone] Hours of Operation:  9 am - 6 pm, M-F.  Also accepts Medicaid/Medicare and self-pay.  [facility]  [address], [place] Phone: [phone], Fax: [phone]. Hours of Operation:  8:30 am - 5:30 pm, M-F.  Also accepts Medicaid and self-pay.  [facility] [place] [address] [place] Phone: [phone]   [facility] [address] Natasha Bence [place], [place] [phone], Ext. [phone] Mondays & Thursdays: 7-9 AM.  First 15 patients are seen on a first come, first serve basis.    Medicaid-accepting [facility] Providers:  Organization         Address  Phone   Notes  [facility] [address], [place] [phone] Also accepts self-pay patients.  [facility] [address] Laurell Josephs [place], [place]  [phone]   [facility] [address], [place] [phone]   [facility] Family  Medicine [address] [place] [phone]   Renaye Rakers [address], [place]   [phone] Only accepts [place] Access [place] patients after they have their name applied to their card.   Self-Pay (no insurance) in [employer]:  Organization         Address  Phone   Notes  Sickle Cell Patients, [facility] [address] [place], [place] [phone]   [facility] Care [address] [place], [place] [phone]   [facility] [place]  [address], [place] [phone]   [facility]/Dr. Osei-Bonsu  [address] [place] or [address], [place] [phone] Phone number for both [place] and [place] locations is the same.  [facility] [address] [place] [phone]   [facility] [address] [place] or [address] [phone]   [facility] [address] [place] [phone], phone; [phone], fax Sees patients 1st and 3rd Saturday of every month.  Must not qualify for public or private insurance (i.e. Medicaid, Medicare, [place] Health Choice, Veterans' Benefits)  Household income should be no more than 200% of the poverty level The clinic cannot treat you if you are pregnant or think you are pregnant  Sexually transmitted diseases are not treated at the clinic.    Dental Care: Organization         Address  Phone  Notes  [facility] [address] [place], [place] [phone] Accepts children up to age 26 who are enrolled in [place] or  Health Choice; pregnant women with a Medicaid card; and children who have applied  for Medicaid or [place] Health Choice, but were declined, whose parents can pay a reduced fee at time of service.  [facility] Department of [facility]  [address], [place] [phone] Accepts children up to age 47 who are enrolled in [place] or [place] Health Choice; pregnant women with a Medicaid card; and children who have applied for Medicaid or [place] Health Choice, but were declined, whose parents can pay a reduced fee at time of service.  [facility]  [address] [place], [place] [phone] Patients are seen by appointment only. Walk-ins are not accepted. [facility] will see patients 30 years of age and older. Monday - Tuesday (8am-5pm) Most Wednesdays (8:30-5pm) $30 per visit, cash only  [facility] Adult [facility]  [address], [facility] [phone] Patients are seen by appointment only. Walk-ins are not accepted. [facility] will see patients 26 years of age and older. One Wednesday Evening (Monthly: Volunteer Based).  $30 per visit, cash only  [employer]  [phone] for adults; Children under age 12, call Graduate Pediatric Dentistry at [phone]. Children aged 49-14, please call [phone] to request a pediatric application.  Dental services are provided in all areas of dental care including fillings, crowns and bridges, complete and partial dentures, implants, gum treatment, root canals, and extractions. Preventive care is also provided. Treatment is provided to both adults and children. Patients are selected via a lottery and there is often a waiting list.   [facility] [address] [place]  [phone] [email or web address]   [facility] [address] [place], [place] [phone], Ext. [phone] Second and Fourth Thursday of each month, opens at 6:30 AM; Clinic ends at 9 AM.  Patients are seen on a first-come first-served basis, and a limited number are seen during each clinic.   [facility]  [address] Ether Griffins [place], [place] [phone]   Eligibility Requirements You must have lived in  [place], [place], or [place] counties for at least the last three months.   You cannot be eligible for state or federal sponsored [place], including [employer], [place], or [employer].   You generally cannot be eligible for healthcare insurance through your employer.    How to apply: Eligibility screenings are held every Tuesday and Wednesday afternoon from 1:00 pm until 4:00 pm. You do not need an appointment for the interview!  [facility] [address], [place], [place] [phone]   [facility] Health Department  [phone]   [facility] Health Department  [phone]   [facility] Health Department  [phone]    Behavioral Health Resources in the Community: Intensive Outpatient Programs Organization         Address  Phone  Notes  [facility] Services [address] [place], [place] [phone]   [facility] Outpatient [address] [place], [place] [phone]   [facility] [address] [place], [place]  [phone]   [facility] Mental Health [address]  [place], [place] [phone] or [phone]   Substance Abuse Resources Organization         Address  Phone  Notes  [facility]  [phone]   [facility]  [phone]   [facility] [place]  [phone]   Floydene Flock  [phone]   Residential & Outpatient Substance Abuse  Program  [phone]   Psychological Services Organization         Address  Phone  Notes  [facility] Behavioral Health  [phone]   [facility] Services  [phone]   [facility] Mental Health [phone] [address] [place] [phone] or [phone]    Mobile Crisis Teams Organization         Address  Phone  Notes  [facility], Mobile Crisis Care Unit  [phone]   [facility]  [address] [place], [place] [phone]   Doristine Locks [address] [place] [phone]    Self-Help/Support Groups Organization         Address  Phone             Notes  [facility] [place] - variety of support groups  336- [phone] Call for more information  Narcotics Anonymous (NA), [facility] [address], [employer] [place]  2 meetings at this location   Statistician         Address  Phone  Notes  [facility] [address],    [place]  [phone]   [facility]  [address] [place] [address]32, [place], [place] [phone]   [facility] [address] [place], [place] [phone] Admissions: 8am-3pm M-F  [facility] [address],    [place], [place] [phone]   [facility] [address] [place], [place], [place] [phone]   [facility] [address]  [place], [place] [phone]   [facility] - Intensive Outpatient [address] Laurell Josephs [address], [place], [place] [phone]   [facility] (Addiction Recovery [facility] [address] [place]  [place], [place] [phone] or [phone]   [facility] ([facility]) [address] [place], [place] [phone] Accepts Medicaid  [facility] [address],  [place] [phone] Substance Abuse/Addiction Treatment   [facility] Organization         Address  Phone  Notes  [facility]  [phone]   Angie Fava, PhD [address] Ervin Knack [place], [place]   [phone] or [phone]   [facility] Behavioral   [address] [address] [place], [place] [id number]   [facility] [address], [place], [place] [phone] Insurance/Medicaid/sponsorship through [facility] [address]                                    [place], [place] [phone] Therapy/tele-psych/case  [facility] [address], [place] [phone]    Dr. Lolly Mustache  [phone]   [facility]  [employer] [facility] Dept. 1) [address] [place] 2) [address] [place] 3)  [address], [place] [phone]  [phone]   [facility] Child Abuse Hotline [phone] or [phone] (After Hours)

## 2014-11-24 LAB — CULTURE, GROUP A STREP

## 2015-09-19 IMAGING — CR DG CHEST 2V
2 series · 2 of 2 positions shown · non-contrast
Comparison: 07/08/2010

CLINICAL DATA: Cough, congestion, fever

EXAM:
CHEST  2 VIEW

[w chest pa]
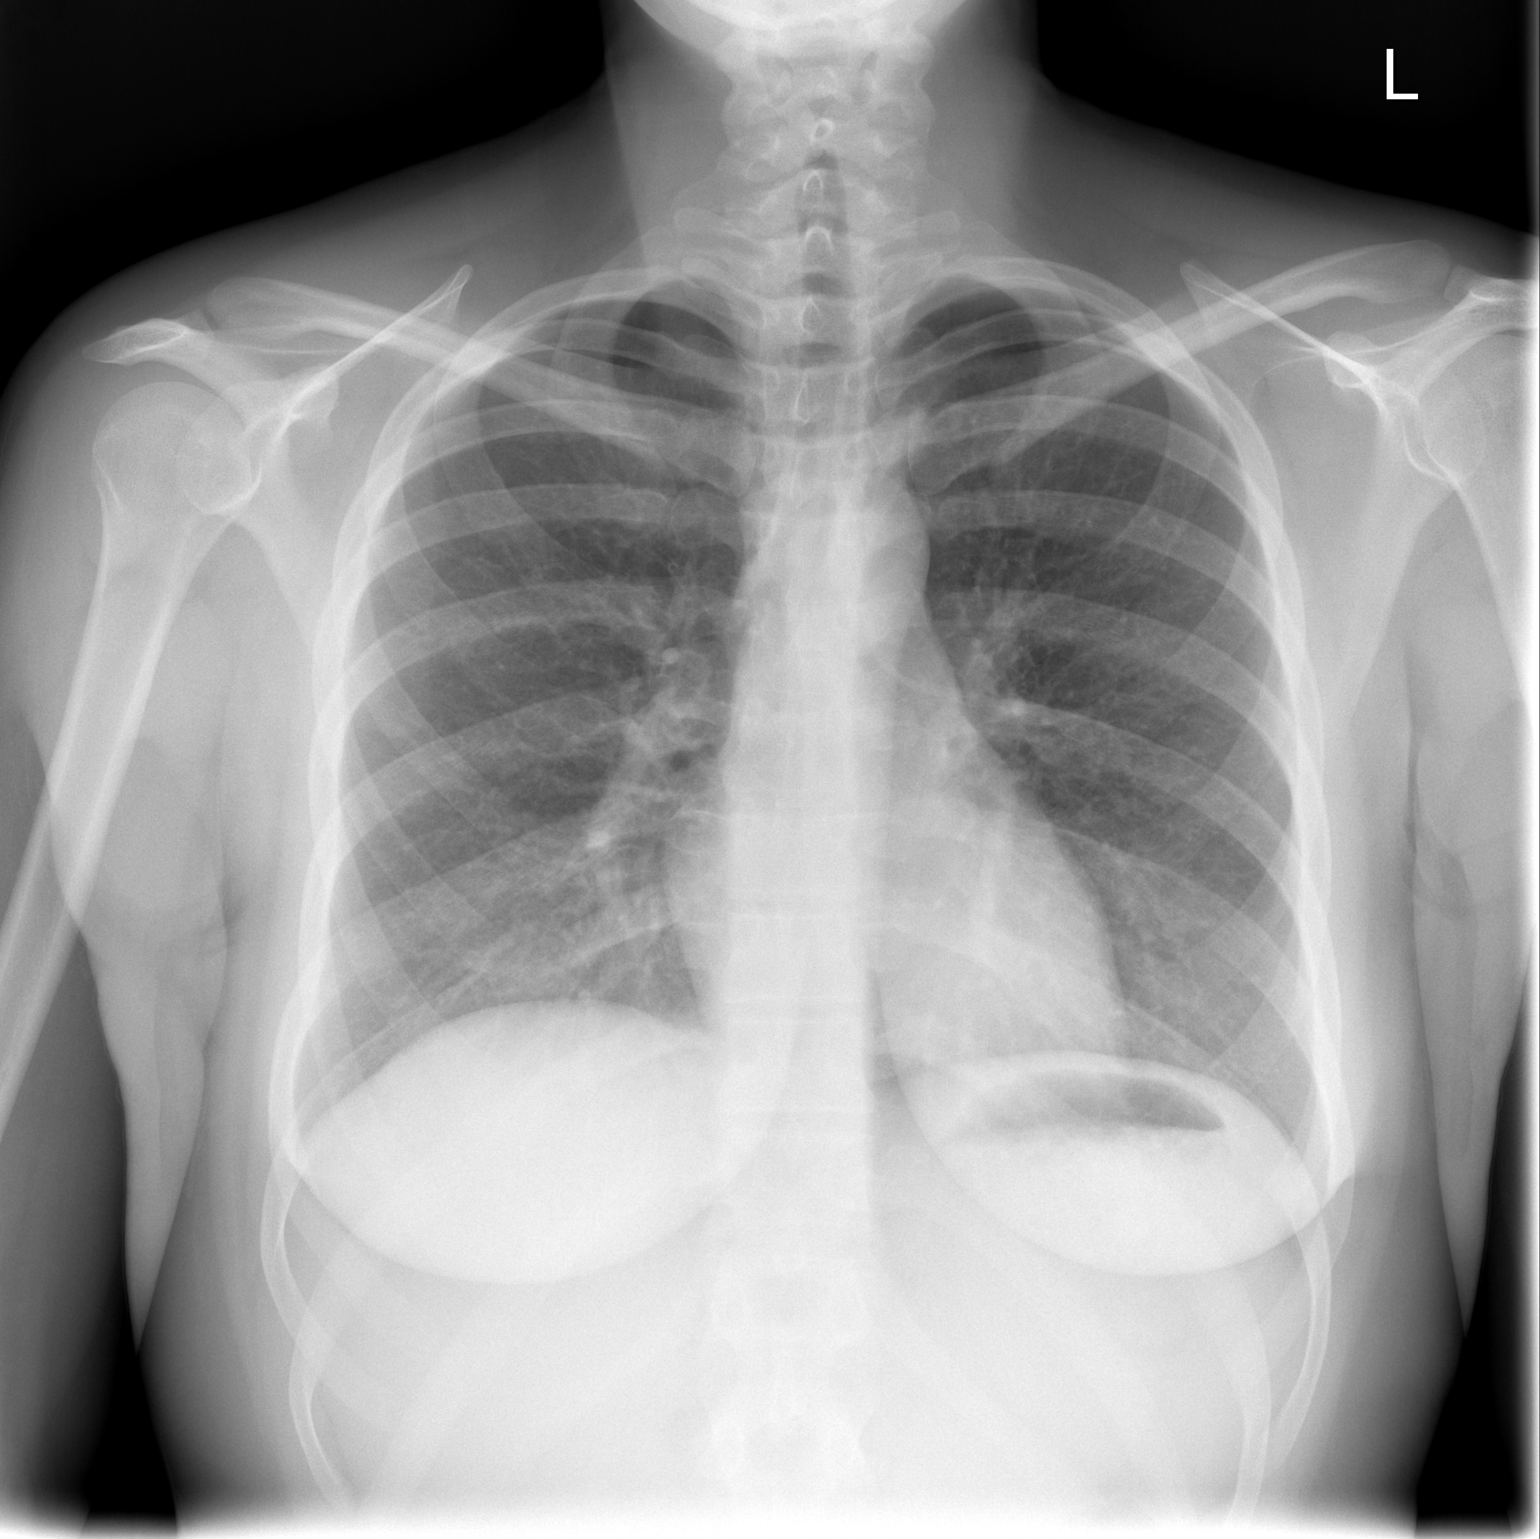

[w chest lat]
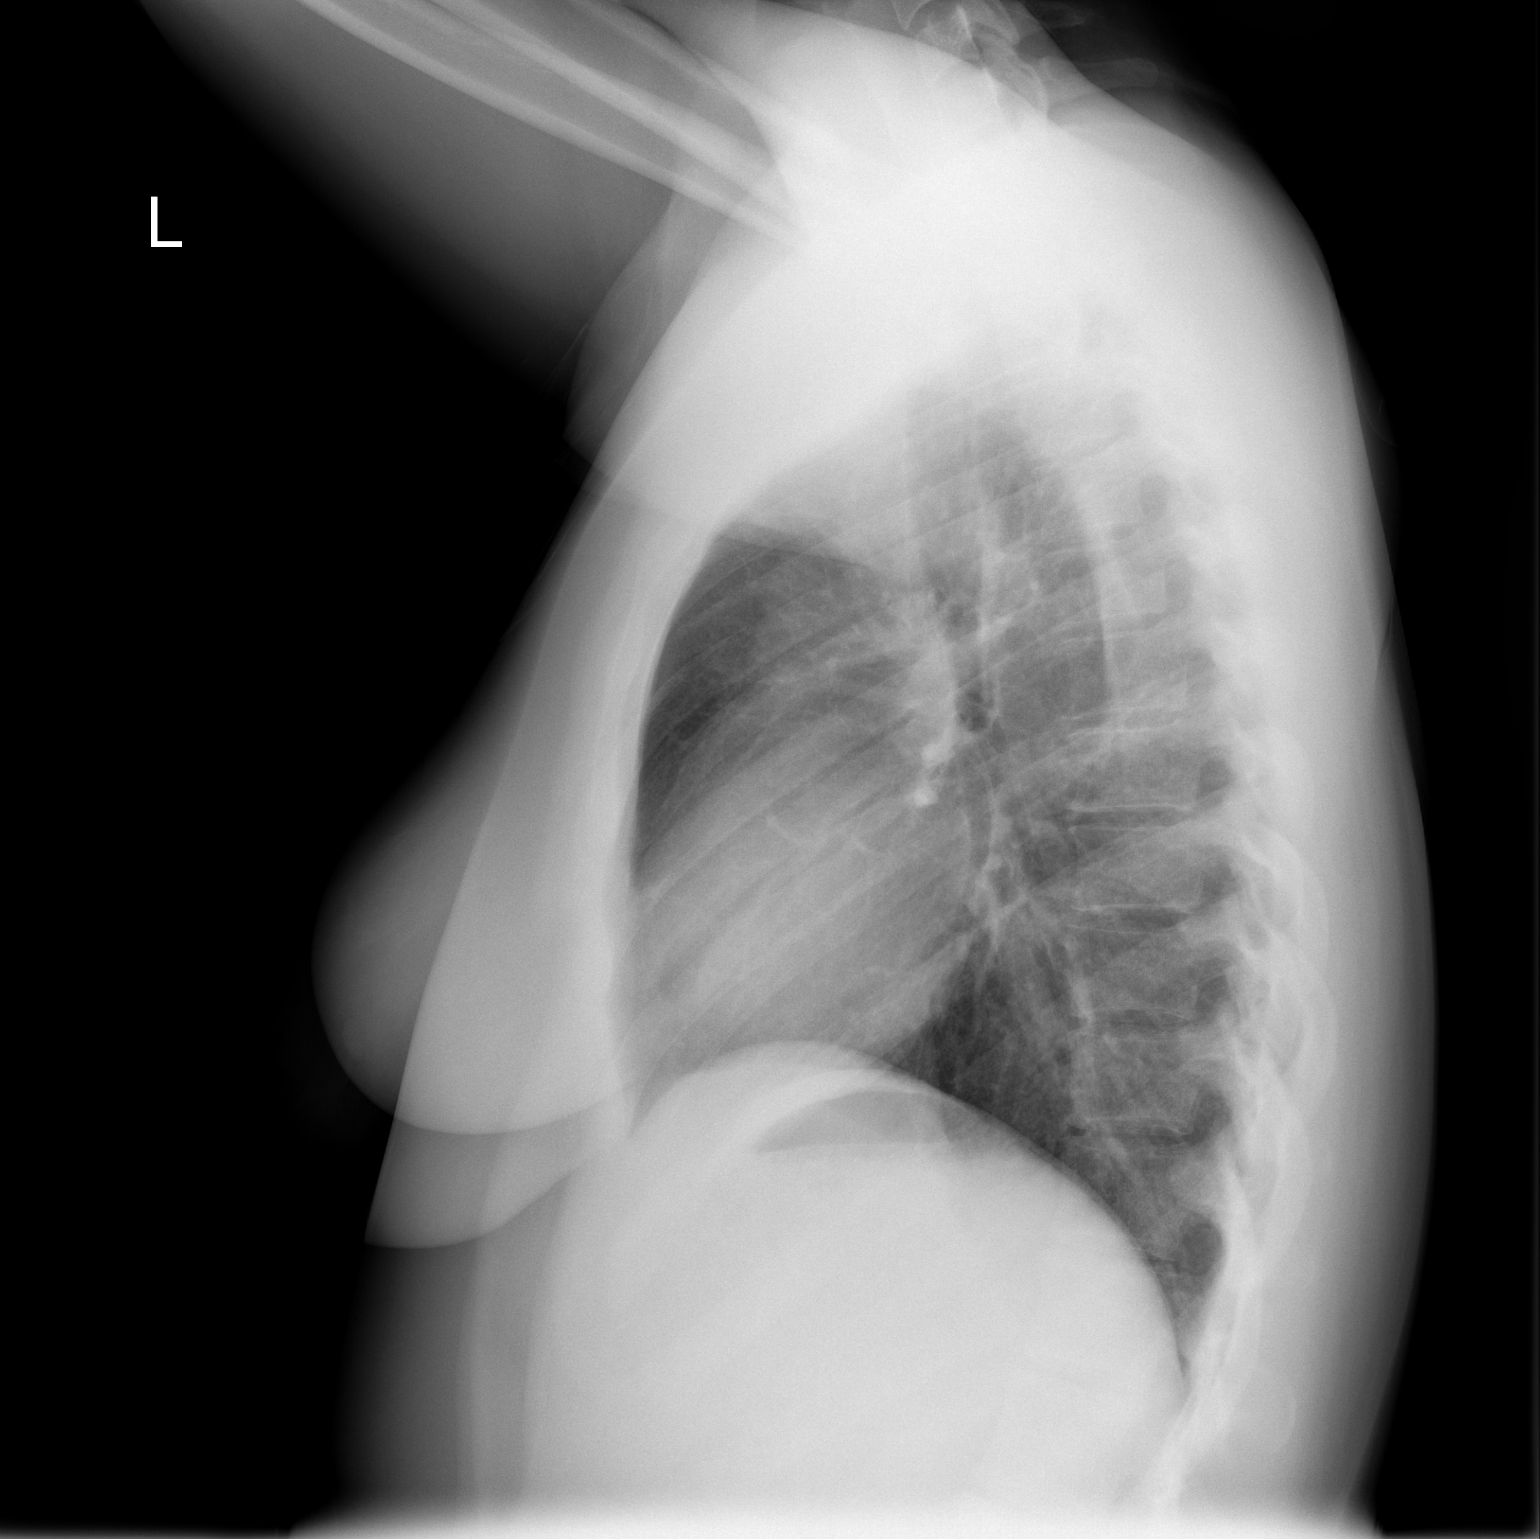

[2 of 2 positions shown; findings below may reference images not displayed]

FINDINGS: The heart size and mediastinal contours are within normal limits.
Both lungs are clear. The visualized skeletal structures are
unremarkable.
IMPRESSION: No active cardiopulmonary disease.

## 2017-07-20 ENCOUNTER — Emergency Department (HOSPITAL_BASED_OUTPATIENT_CLINIC_OR_DEPARTMENT_OTHER)
Admission: EM | Admit: 2017-07-20 | Discharge: 2017-07-20 | Disposition: A | Discharge status: Home / Self Care | Payer: Self-pay | Attending: Physician Assistant | Admitting: Physician Assistant

## 2017-07-20 ENCOUNTER — Emergency Department (HOSPITAL_BASED_OUTPATIENT_CLINIC_OR_DEPARTMENT_OTHER): Payer: Self-pay

## 2017-07-20 ENCOUNTER — Encounter (HOSPITAL_BASED_OUTPATIENT_CLINIC_OR_DEPARTMENT_OTHER): Payer: Self-pay | Admitting: Emergency Medicine

## 2017-07-20 DIAGNOSIS — J029 Acute pharyngitis, unspecified: Secondary | ICD-10-CM | POA: Insufficient documentation

## 2017-07-20 DIAGNOSIS — R0981 Nasal congestion: Secondary | ICD-10-CM | POA: Insufficient documentation

## 2017-07-20 DIAGNOSIS — Z79899 Other long term (current) drug therapy: Secondary | ICD-10-CM | POA: Insufficient documentation

## 2017-07-20 DIAGNOSIS — R05 Cough: Secondary | ICD-10-CM | POA: Insufficient documentation

## 2017-07-20 DIAGNOSIS — Z87891 Personal history of nicotine dependence: Secondary | ICD-10-CM | POA: Insufficient documentation

## 2017-07-20 DIAGNOSIS — J069 Acute upper respiratory infection, unspecified: Secondary | ICD-10-CM | POA: Insufficient documentation

## 2017-07-20 DIAGNOSIS — J3489 Other specified disorders of nose and nasal sinuses: Secondary | ICD-10-CM | POA: Insufficient documentation

## 2017-07-20 DIAGNOSIS — H9390 Unspecified disorder of ear, unspecified ear: Secondary | ICD-10-CM | POA: Insufficient documentation

## 2017-07-20 LAB — RAPID STREP SCREEN (MED CTR MEBANE ONLY): STREPTOCOCCUS, GROUP A SCREEN (DIRECT): NEGATIVE

## 2017-07-20 MED ORDER — BENZONATATE 100 MG PO CAPS: 100.0000 mg | ORAL_CAPSULE | Freq: Three times a day (TID) | ORAL | 0 refills | Status: AC

## 2017-07-20 MED ORDER — PENICILLIN G BENZATHINE 1200000 UNIT/2ML IM SUSP
1.2000 10*6.[IU] | Freq: Once | INTRAMUSCULAR | Status: AC
  Administered 2017-07-20: 1.2 10*6.[IU] via INTRAMUSCULAR
  Filled 2017-07-20: qty 2

## 2017-07-20 MED ORDER — GUAIFENESIN 100 MG PO PACK: PACK | ORAL | 0 refills | Status: AC

## 2017-07-20 NOTE — ED Notes (Signed)
ED Provider at bedside. 

## 2017-07-20 NOTE — Discharge Instructions (Signed)
Please read and follow all provided instructions.  Your diagnoses today include:  1. Pharyngitis, unspecified etiology   2. Viral upper respiratory tract infection     Tests performed today include: Vital signs. See below for your results today.  Chest xray - negative  Strep test - this was negative but your exam was concerning for strep so you were treated for this today.   Medications prescribed/advised:  1. Musinex [Guaifenesin] as a decongestant [thin mucus - you have to be well hydrated when taking this for it to work],  2. Tylenol for fever/pain and Motrin/Ibuprofen for muscle aches 3.  Cough Suppressant (tessalon pearls) - take as directed:   Home care instructions:  An upper respiratory infection (URI) is also sometimes known as the common cold. Most people improve within 1 week, but symptoms can last up to 2 weeks. A residual cough may last even longer.   URI is most commonly caused by a virus. Viruses are NOT treated with antibiotics. You can easily spread the virus to others by oral contact. This includes kissing, sharing a glass, coughing, or sneezing. Touching your mouth or nose and then touching a surface, which is then touched by another person, can also spread the virus.   TREATMENT  Treatment is directed at relieving symptoms. There is no cure. Antibiotics are not effective, because the infection is caused by a virus, not by bacteria. Treatment may include:  Increased fluid intake. Sports drinks offer valuable electrolytes, sugars, and fluids.  Breathing heated mist or steam (vaporizer or shower).  Eating chicken soup or other clear broths, and maintaining good nutrition.  Getting plenty of rest.  Using gargles or lozenges for comfort.  Controlling fevers with ibuprofen or acetaminophen as directed by your caregiver.  Increasing usage of your inhaler if you have asthma.  Return to work when your temperature has returned to normal.   Follow-up instructions: Followup  with your primary care doctor in 4 days if your symptoms persist.  Your more than welcome to return to the emergency department if symptoms worsen or become concerning.  Return instructions:  Please return to the Emergency Department if you do not get better, if you get worse, or new symptoms OR  You have difficulty breathing. You develop chest pain You cannot swallow fluids, soft foods, or your saliva. You have increased swelling in your throat or neck. You have persistent nausea and vomiting. Your throat swells on one side Please return if you have any other emergent concerns.  Additional Information:  Your vital signs today were: BP 127/74 (BP Location: Right Arm)    Pulse 98    Temp 98.3 F (36.8 C) (Oral)    Resp 18    Ht  (1.651 m)    Wt 86.2 kg (190 lb)    SpO2 99%    BMI 31.62 kg/m  If your blood pressure (BP) was elevated above 135/85 this visit, please have this repeated by your doctor within one month.

## 2017-07-20 NOTE — ED Triage Notes (Signed)
Reports "sick for a month". Coughing for a month, sore throat since yesterday.

## 2017-07-20 NOTE — ED Provider Notes (Signed)
[facility]   CSN: [id number] Arrival date & time: 07/20/17  1356     History   Chief Complaint Chief Complaint  Patient presents with  . Sore Throat    HPI Debbie Rojas is a 31 y.o. female who presents to the emergency department today for 1 month of illness. The patient states that one month ago she started developing a dry, nonproductive cough. She states that she has had bronchitis in the past and had left over medication which she has been taking Hydrocodone cough syrup and Tessalon for the cough with relief. She notes that over the last several weeks she has had increased congestion, sinus pain, ear fullness and the cough turned productive in the morning time when she awakens. She notes that it is usually clear or yellow phlegm. No hemoptysis. Yesterday she started developing a sore throat, primarily on the left side. Denies shortness of breath, history of intubations, or hospitalizations. She is without recent abx use, or ACE inhibitor use. No fever, chills, inability to control secretions, N/V, abdominal pain, voice change, dental disease, trauma, chest pain, leg swelling or leg pain. Patient is former smoker of 8 years. No increased fatigue or sick contacts.   HPI  Past Medical History:  Diagnosis Date  . Anemia   . Anemia   . Anxiety   . Chronic asthmatic bronchitis (HCC)     There are no active problems to display for this patient.   Past Surgical History:  Procedure Laterality Date  . TOOTH EXTRACTION      OB History    Gravida Para Term Preterm AB Living   SAB TAB Ectopic Multiple Live Births                   Home Medications    Prior to Admission medications   Medication Sig Start Date End Date Taking? Authorizing Provider  clonazePAM (KLONOPIN) 1 MG tablet Take 1 mg by mouth 2 (two) times daily.   Yes [provider]  azithromycin (ZITHROMAX Z-PAK) 250 MG tablet 2 po day one, then 1 daily x 4 days  10/15/13   Molpus, John, MD  benzonatate (TESSALON) 100 MG capsule Take 1 capsule (100 mg total) by mouth every 8 (eight) hours. 07/20/17   Maczis, Elmer Sow, PA-C  cetirizine-pseudoephedrine (ZYRTEC-D) 5-120 MG per tablet Take 1 tablet by mouth daily. 06/27/14   Elson Areas, PA-C  chlorpheniramine-HYDROcodone (TUSSIONEX PENNKINETIC ER) 10-8 MG/5ML LQCR Take 5 mLs by mouth every 12 (twelve) hours as needed for cough. 10/15/13   Molpus, John, MD  cyclobenzaprine (FLEXERIL) 5 MG tablet Take 5 mg by mouth 3 (three) times daily as needed. For back pain.     [provider]  Guaifenesin 100 MG PACK 200 to 400 mg ORALLY every 4 hours as needed for congestion; MAX 2400 mg/day 07/20/17   Maczis, Elmer Sow, PA-C  Prenatal Vit-Fe Fumarate-FA (MULTIVITAMIN-PRENATAL) 27-0.8 MG TABS Take 1 tablet by mouth daily.      [provider]    Family History No family history on file.  Social History Social History  Substance Use Topics  . Smoking status: Former Games developer  . Smokeless tobacco: Never Used  . Alcohol use Yes     Comment: occ     Allergies   Sulfa antibiotics   Review of Systems Review of Systems  Constitutional: Negative for chills and fever.  HENT: Positive for  congestion, rhinorrhea, sinus pain, sinus pressure and sore throat. Negative for ear pain, facial swelling, sneezing, trouble swallowing and voice change.   Eyes: Negative for redness.  Respiratory: Positive for cough. Negative for shortness of breath.   Cardiovascular: Negative for chest pain and leg swelling.  Gastrointestinal: Negative for abdominal pain, diarrhea, nausea and vomiting.  Musculoskeletal: Negative for arthralgias.  Skin: Negative for rash.  Neurological: Negative for weakness and numbness.  All other systems reviewed and are negative.    Physical Exam Updated Vital Signs BP (!) 126/91 (BP Location: Right Arm)   Pulse 95   Temp 98.3 F (36.8 C) (Oral)   Resp 16   Ht  (1.651 m)    Wt 86.2 kg (190 lb)   SpO2 100%   BMI 31.62 kg/m   Physical Exam  Constitutional: She appears well-developed and well-nourished. No distress.  Nontoxic-appearing  HENT:  Head: Normocephalic and atraumatic.  Right Ear: Hearing, tympanic membrane, external ear and ear canal normal. No foreign bodies. Tympanic membrane is not injected, not perforated, not erythematous, not retracted and not bulging.  Left Ear: Hearing, tympanic membrane, external ear and ear canal normal. No foreign bodies. Tympanic membrane is not injected, not perforated, not erythematous, not retracted and not bulging.  Nose: Mucosal edema and rhinorrhea present. No sinus tenderness, septal deviation or nasal septal hematoma.  No foreign bodies. Right sinus exhibits no maxillary sinus tenderness and no frontal sinus tenderness. Left sinus exhibits no maxillary sinus tenderness and no frontal sinus tenderness.  Mouth/Throat: Uvula is midline, oropharynx is clear and moist and mucous membranes are normal. No tonsillar exudate.  The patient has normal phonation and is in control of secretions. No stridor.  Midline uvula without edema. Soft palate rises symmetrically. Mild tonsillar erythema or exudates. No PTA. Tongue protrusion is normal. No trismus. No creptius on neck palpation and patient has good dentition. No gingival erythema or fluctuance noted. Mucus membranes moist.  Eyes: Pupils are equal, round, and reactive to light. Conjunctivae, EOM and lids are normal. Right eye exhibits no discharge. Left eye exhibits no discharge. Right conjunctiva is not injected. Left conjunctiva is not injected. No scleral icterus.  Neck: Trachea normal, normal range of motion, full passive range of motion without pain and phonation normal. Neck supple. No spinous process tenderness and no muscular tenderness present. No neck rigidity. No tracheal deviation and normal range of motion present.  Cardiovascular: Normal rate, regular rhythm and intact  distal pulses.   No murmur heard. Pulses:      Radial pulses are 2+ on the right side, and 2+ on the left side.       Dorsalis pedis pulses are 2+ on the right side, and 2+ on the left side.       Posterior tibial pulses are 2+ on the right side, and 2+ on the left side.  No lower extremity swelling or edema. Calves symmetric in size bilaterally.  Pulmonary/Chest: Effort normal and breath sounds normal. No stridor. She has no wheezes. She exhibits no tenderness.  Abdominal: Soft. Bowel sounds are normal. There is no tenderness. There is no rebound and no guarding.  Musculoskeletal: She exhibits no edema.  Lymphadenopathy:       Head (right side): No submental, no submandibular, no tonsillar, no preauricular, no posterior auricular and no occipital adenopathy present.       Head (left side): No submental, no submandibular, no tonsillar, no preauricular, no posterior auricular and no occipital adenopathy present.  She has cervical adenopathy (anterior, shotty).  Neurological: She is alert.  Skin: Skin is warm and dry. No rash noted. She is not diaphoretic.  Psychiatric: She has a normal mood and affect.  Nursing note and vitals reviewed.    ED Treatments / Results  Labs (all labs ordered are listed, but only abnormal results are displayed) Labs Reviewed  RAPID STREP SCREEN (NOT AT [facility])  CULTURE, GROUP A STREP [facility])    EKG  EKG Interpretation None       Radiology Dg Chest 2 View  Result Date: 07/20/2017 CLINICAL DATA:  31 year old female with a history of cough and congestion for a month EXAM: CHEST  2 VIEW COMPARISON:  10/14/2013 FINDINGS: The heart size and mediastinal contours are within normal limits. Both lungs are clear. The visualized skeletal structures are unremarkable. IMPRESSION: No active cardiopulmonary disease. Electronically Signed   By: Gilmer Mor D.O.   On: 07/20/2017 14:16    Procedures Procedures (including critical care time)  Medications Ordered  in ED Medications  penicillin g benzathine (BICILLIN LA) 1200000 UNIT/2ML injection 1.2 Million Units (1.2 Million Units Intramuscular Given 07/20/17 1711)     Initial Impression / Assessment and Plan / ED Course  I have reviewed the triage vital signs and the nursing notes.  Pertinent labs & imaging results that were available during my care of the patient were reviewed by me and considered in my medical decision making (see chart for details).     This is a 31 year old female presenting with URI like symptoms and ST. The patient is with tonsillar erythema, exudate, anterior cervical LAD on exam. Will treat the patient with PCN IM for bacterial pharyngitis. Patient does not appear to be dehydrated and has been handling PO intact without difficulty. Discussed importance of water rehydration. Presentation non concerning for PTA or RPA. No trismus or uvula deviation. Patient also with cough for several weeks. Pt CXR negative for acute infiltrate. Lungs CTA. Patients symptoms are consistent with URI. Pt will be discharged with symptomatic treatment for this. Specific return precautions discussed. Pt able to drink water in ED without difficulty with intact air way. Recommended PCP follow up. Patient verbalizes understanding and is agreeable with plan. Pt is hemodynamically stable & in NAD prior to dc.  Final Clinical Impressions(s) / ED Diagnoses   Final diagnoses:  Pharyngitis, unspecified etiology  Viral upper respiratory tract infection    New Prescriptions Discharge Medication List as of 07/20/2017  5:05 PM    START taking these medications   Details  benzonatate (TESSALON) 100 MG capsule Take 1 capsule (100 mg total) by mouth every 8 (eight) hours., Starting Thu 07/20/2017, Print    Guaifenesin 100 MG PACK 200 to 400 mg ORALLY every 4 hours as needed for congestion; MAX 2400 mg/day, Print         Jacinto Halim, PA-C 07/21/17 0041    Abelino Derrick, MD 07/21/17  [phone]

## 2017-07-22 ENCOUNTER — Encounter (HOSPITAL_BASED_OUTPATIENT_CLINIC_OR_DEPARTMENT_OTHER): Payer: Self-pay | Admitting: Emergency Medicine

## 2017-07-22 ENCOUNTER — Emergency Department (HOSPITAL_BASED_OUTPATIENT_CLINIC_OR_DEPARTMENT_OTHER)
Admission: EM | Admit: 2017-07-22 | Discharge: 2017-07-22 | Disposition: A | Discharge status: Home / Self Care | Payer: Medicaid Other | Attending: Emergency Medicine | Admitting: Emergency Medicine

## 2017-07-22 DIAGNOSIS — R05 Cough: Secondary | ICD-10-CM | POA: Insufficient documentation

## 2017-07-22 DIAGNOSIS — B9789 Other viral agents as the cause of diseases classified elsewhere: Secondary | ICD-10-CM | POA: Insufficient documentation

## 2017-07-22 DIAGNOSIS — J069 Acute upper respiratory infection, unspecified: Secondary | ICD-10-CM

## 2017-07-22 DIAGNOSIS — J029 Acute pharyngitis, unspecified: Secondary | ICD-10-CM | POA: Insufficient documentation

## 2017-07-22 DIAGNOSIS — Z79899 Other long term (current) drug therapy: Secondary | ICD-10-CM | POA: Insufficient documentation

## 2017-07-22 DIAGNOSIS — Z87891 Personal history of nicotine dependence: Secondary | ICD-10-CM | POA: Insufficient documentation

## 2017-07-22 MED ORDER — LIDOCAINE VISCOUS 2 % MT SOLN: 15.0000 mL | OROMUCOSAL | 0 refills | Status: AC | PRN

## 2017-07-22 MED ORDER — DEXAMETHASONE SODIUM PHOSPHATE 10 MG/ML IJ SOLN
10.0000 mg | Freq: Once | INTRAMUSCULAR | Status: AC
  Administered 2017-07-22: 10 mg via INTRAMUSCULAR
  Filled 2017-07-22: qty 1

## 2017-07-22 MED ORDER — KETOROLAC TROMETHAMINE 60 MG/2ML IM SOLN
60.0000 mg | Freq: Once | INTRAMUSCULAR | Status: AC
  Administered 2017-07-22: 60 mg via INTRAMUSCULAR
  Filled 2017-07-22: qty 2

## 2017-07-22 MED ORDER — GI COCKTAIL ~~LOC~~
30.0000 mL | Freq: Once | ORAL | Status: AC
  Administered 2017-07-22: 30 mL via ORAL
  Filled 2017-07-22: qty 30

## 2017-07-22 NOTE — Discharge Instructions (Signed)
You were treated for your symptoms in the emergency department today with Decadron, a GI cocktail which contains lidocaine, and Toradol.  Eating and drinking warm and cold liquids may help with your sore throat pain. You can take Tylenol or ibuprofen as instructed on the bottle for body aches and pain. 15 mL of viscous lidocaine may be taken once every 3 hours to help with sore throat. Please do not use this medication more than prescribed.  If you develop new or worsening symptoms, including muffled voice, difficulty breathing, or severe swelling to the throat or neck, please return to the emergency department for reevaluation.

## 2017-07-22 NOTE — ED Triage Notes (Signed)
"   I was here on Thursday for sore throat and it is getting worse"

## 2017-07-22 NOTE — ED Provider Notes (Signed)
[facility]   CSN: [id number] Arrival date & time: 07/22/17  1124     History   Chief Complaint Chief Complaint  Patient presents with  . Sore Throat    HPI Debbie Rojas is a 31 y.o. female who presents to the emergency department with a chief complaint of a constant, worsening sore throat 3 days. She was evaluated 3 days ago in the emergency department and was discharged home with [facility] and guaifenesin. During her previous visit, her rapid strep was negative, but was sent for culture. Her chest x-ray was unremarkable. She was treated with penicillin. Over the last 2 days she feels as if her pain has continued to worsen. She reports associated difficulty swallowing and subjective fever. She denies trismus, respiratory distress, dental pain, or muffled voice.  She reports that she has also had a persistent cough for the last month that is intermittently dry and productive. She denies dyspnea, chest pain. She also complains of sinus pain and pressure.     The history is provided by the patient. No language interpreter was used.    Past Medical History:  Diagnosis Date  . Anemia   . Anemia   . Anxiety   . Chronic asthmatic bronchitis (HCC)     There are no active problems to display for this patient.   Past Surgical History:  Procedure Laterality Date  . TOOTH EXTRACTION      OB History    Gravida Para Term Preterm AB Living   SAB TAB Ectopic Multiple Live Births                   Home Medications    Prior to Admission medications   Medication Sig Start Date End Date Taking? Authorizing Provider  clonazePAM (KLONOPIN) 1 MG tablet Take 1 mg by mouth 2 (two) times daily.   Yes [provider]  azithromycin (ZITHROMAX Z-PAK) 250 MG tablet 2 po day one, then 1 daily x 4 days 10/15/13   Molpus, John, MD  benzonatate (TESSALON) 100 MG capsule Take 1 capsule (100 mg total) by mouth every 8 (eight) hours.  07/20/17   Maczis, Elmer Sow, PA-C  cetirizine-pseudoephedrine (ZYRTEC-D) 5-120 MG per tablet Take 1 tablet by mouth daily. 06/27/14   Elson Areas, PA-C  chlorpheniramine-HYDROcodone (TUSSIONEX PENNKINETIC ER) 10-8 MG/5ML LQCR Take 5 mLs by mouth every 12 (twelve) hours as needed for cough. 10/15/13   Molpus, John, MD  cyclobenzaprine (FLEXERIL) 5 MG tablet Take 5 mg by mouth 3 (three) times daily as needed. For back pain.     [provider]  Guaifenesin 100 MG PACK 200 to 400 mg ORALLY every 4 hours as needed for congestion; MAX 2400 mg/day 07/20/17   Maczis, Elmer Sow, PA-C  lidocaine (XYLOCAINE) 2 % solution Use as directed 15 mLs in the mouth or throat as needed for mouth pain. 07/22/17   Leiliana Foody A, PA-C  Prenatal Vit-Fe Fumarate-FA (MULTIVITAMIN-PRENATAL) 27-0.8 MG TABS Take 1 tablet by mouth daily.      [provider]    Family History No family history on file.  Social History Social History  Substance Use Topics  . Smoking status: Former Games developer  . Smokeless tobacco: Never Used  . Alcohol use Yes     Comment: occ     Allergies   Sulfa antibiotics   Review of Systems Review of Systems  Constitutional: Positive  for chills and fever. Negative for activity change.  HENT: Positive for sinus pain, sinus pressure and sore throat. Negative for ear pain.   Respiratory: Positive for cough. Negative for shortness of breath and wheezing.   Cardiovascular: Negative for chest pain.  Gastrointestinal: Negative for abdominal pain, diarrhea, nausea and vomiting.  Musculoskeletal: Negative for back pain.  Skin: Negative for rash.     Physical Exam Updated Vital Signs BP 114/74 (BP Location: Right Arm)   Pulse 74   Temp 98.7 F (37.1 C) (Oral)   Resp 16   Ht  (1.651 m)   Wt 86.2 kg (190 lb)   SpO2 99%   BMI 31.62 kg/m   Physical Exam  Constitutional: No distress.  HENT:  Head: Normocephalic.  Right Ear: External ear normal. A middle ear  effusion is present.  Left Ear: External ear normal. A middle ear effusion is present.  Nose: Rhinorrhea present. No mucosal edema. Right sinus exhibits no maxillary sinus tenderness and no frontal sinus tenderness. Left sinus exhibits no maxillary sinus tenderness and no frontal sinus tenderness.  Mouth/Throat: Uvula is midline and mucous membranes are normal. Posterior oropharyngeal erythema present. No posterior oropharyngeal edema or tonsillar abscesses. Tonsils are 1+ on the right. Tonsillar exudate.  Eyes: Conjunctivae are normal.  Neck: Neck supple.  Shotty anterior cervical lymph nodes. Tender to palpation over the left anterior neck. Minimal right-sided neck tenderness.  Cardiovascular: Normal rate, regular rhythm, normal heart sounds and intact distal pulses.  Exam reveals no gallop and no friction rub.   No murmur heard. Pulmonary/Chest: Effort normal. No respiratory distress. She has no wheezes. She has no rales.  Abdominal: Soft. She exhibits no distension.  Neurological: She is alert.  Skin: Skin is warm. No rash noted. She is not diaphoretic.  Psychiatric: Her behavior is normal.  Nursing note and vitals reviewed.    ED Treatments / Results  Labs (all labs ordered are listed, but only abnormal results are displayed) Labs Reviewed - No data to display  EKG  EKG Interpretation None       Radiology No results found.  Procedures Procedures (including critical care time)  Medications Ordered in ED Medications  ketorolac (TORADOL) injection 60 mg (60 mg Intramuscular Given 07/22/17 1224)  dexamethasone (DECADRON) injection 10 mg (10 mg Intramuscular Given 07/22/17 1224)  gi cocktail (Maalox,Lidocaine,Donnatal) (30 mLs Oral Given 07/22/17 1224)     Initial Impression / Assessment and Plan / ED Course  I have reviewed the triage vital signs and the nursing notes.  Pertinent labs & imaging results that were available during my care of the patient were reviewed by me  and considered in my medical decision making (see chart for details).     31 year old female presenting to the emergency department with a chief complaint of sore throat 2 days. She was evaluated in the ED 2 days ago and was treated with penicillin and discharged home with [facility]. Her rapid strep and culture were negative 2 days ago. GI cocktail given in the emergency department and the patient reports improvement of her sore throat and cough. Decadron and Toradol were also administered. Will discharge the patient home with viscous lidocaine for her sore throat. Strict return percussion given. No acute distress. The patient safe for discharge at this time.  Final Clinical Impressions(s) / ED Diagnoses   Final diagnoses:  Viral pharyngitis  Viral URI with cough    New Prescriptions Discharge Medication List as of 07/22/2017 12:42 PM  START taking these medications   Details  lidocaine (XYLOCAINE) 2 % solution Use as directed 15 mLs in the mouth or throat as needed for mouth pain., Starting Sat 07/22/2017, Print         Prisila Dlouhy A, PA-C 07/22/17 1659    Maia Plan, MD 07/23/17 [phone]

## 2017-07-23 LAB — CULTURE, GROUP A STREP (THRC)

## 2019-06-25 IMAGING — CR DG CHEST 2V
2 series · 2 of 2 positions shown · non-contrast
Comparison: 10/14/2013

CLINICAL DATA: 30-year-old female with a history of cough and
congestion for a month

EXAM:
CHEST  2 VIEW

[w chest pa]
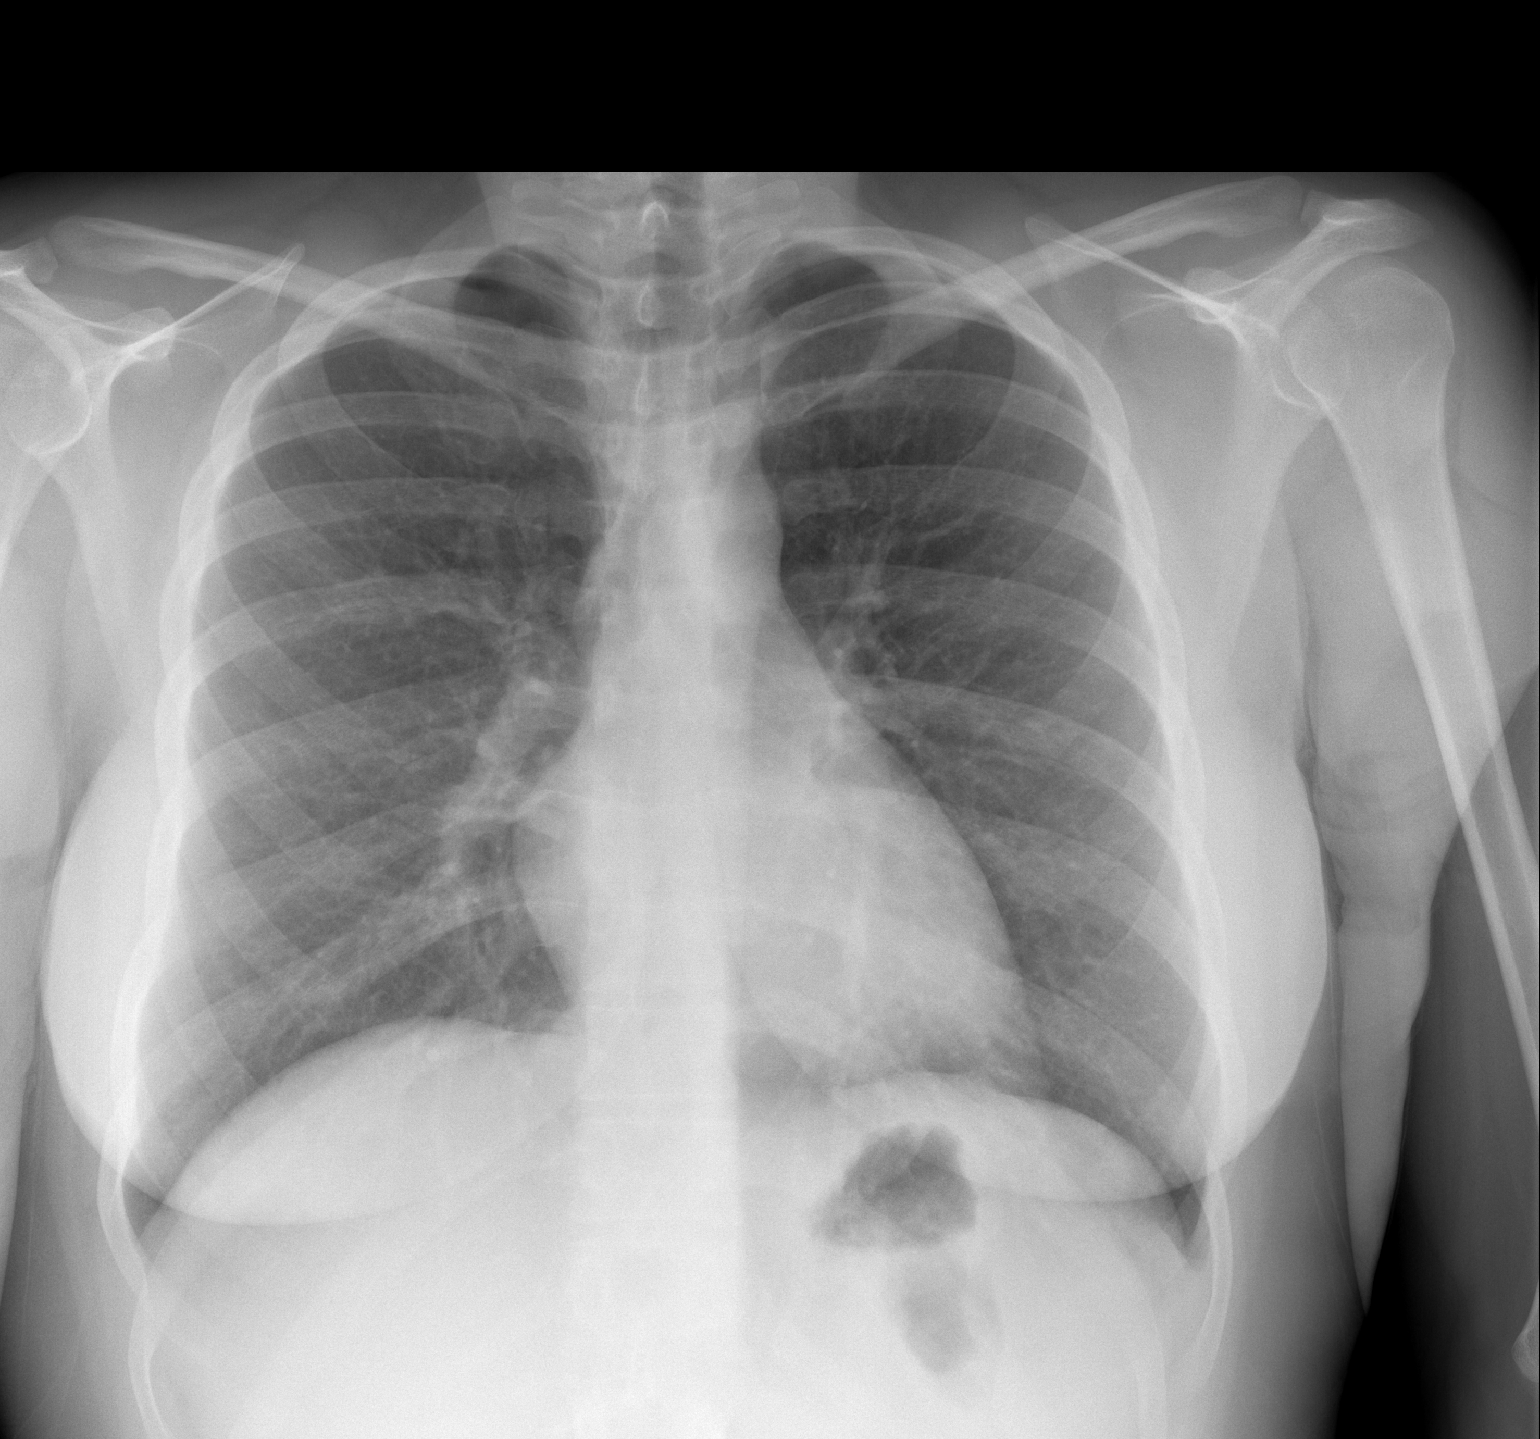

[w chest lat]
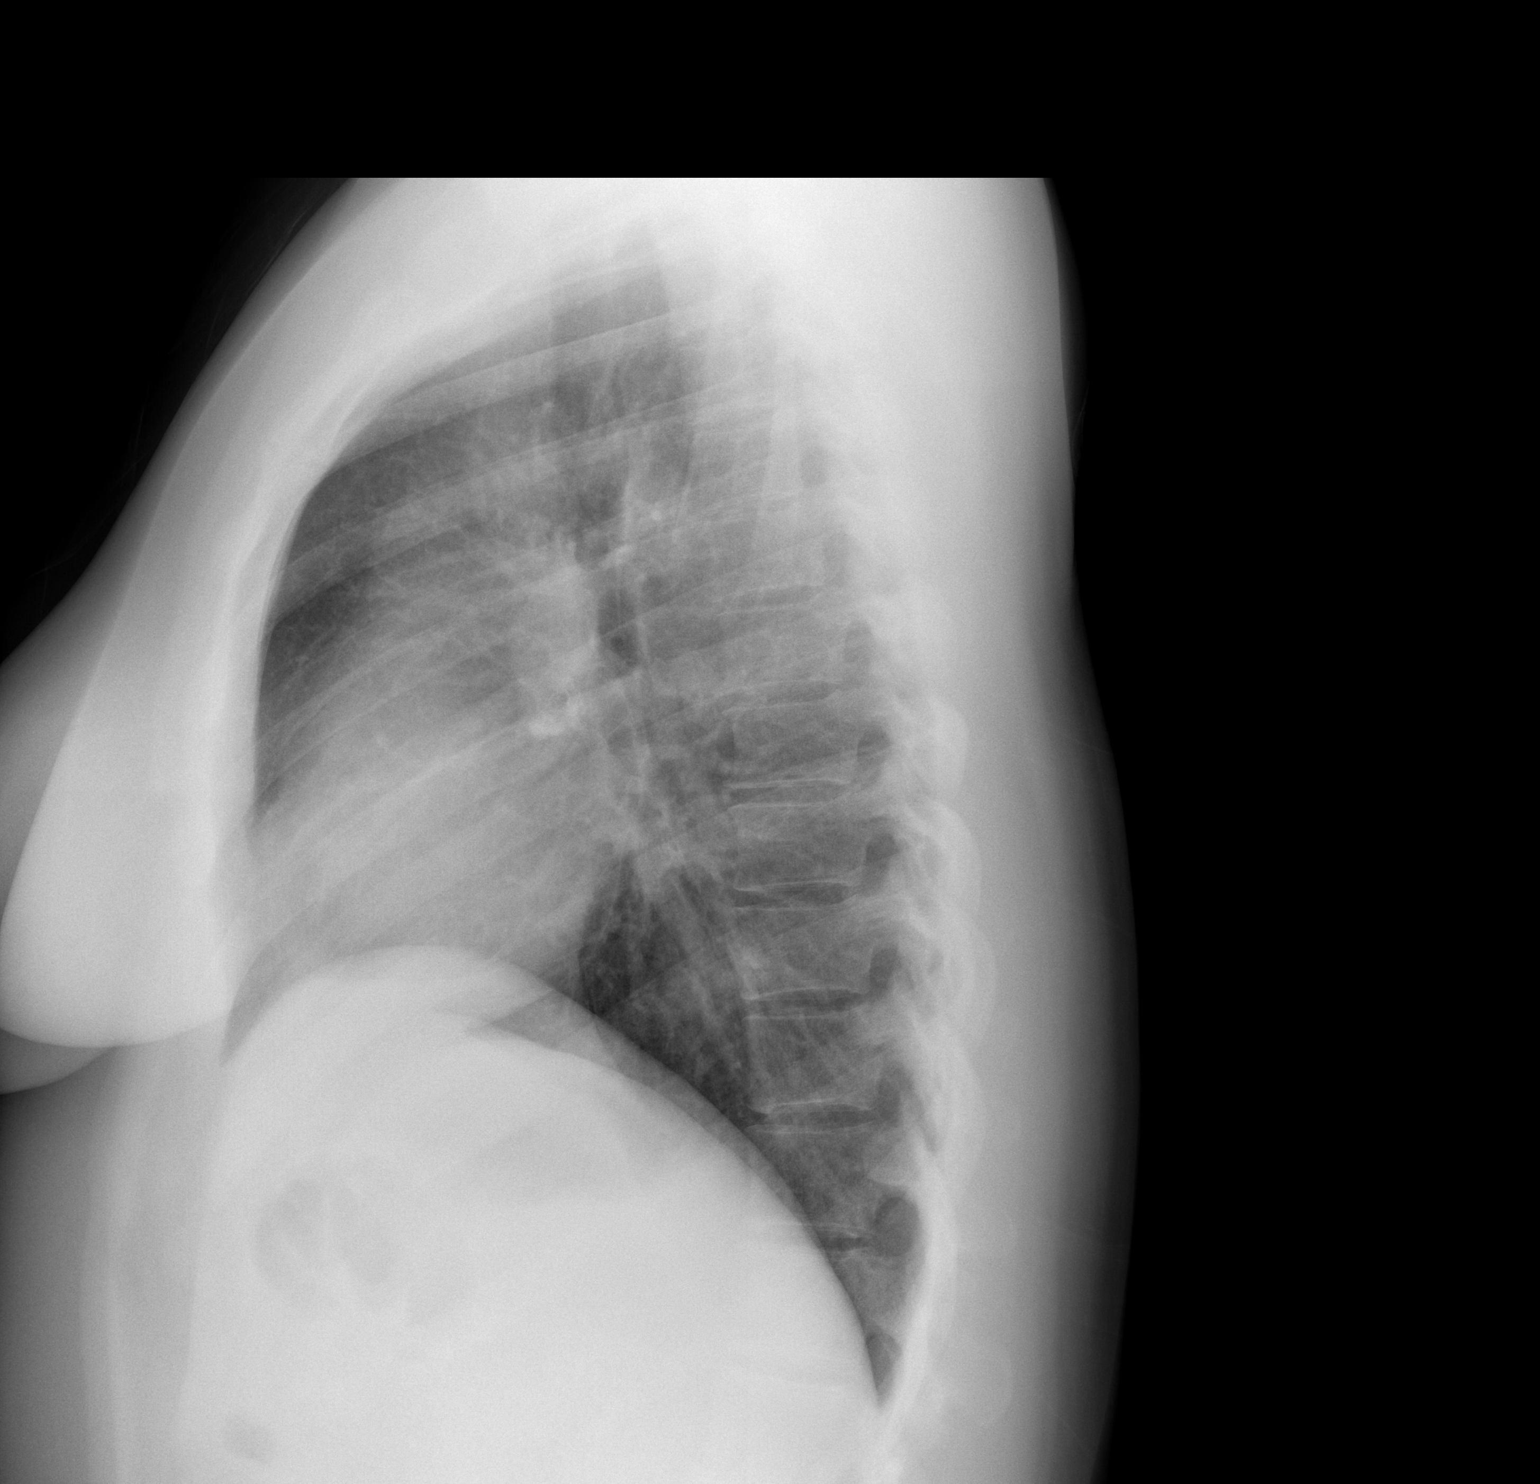

[2 of 2 positions shown; findings below may reference images not displayed]

FINDINGS: The heart size and mediastinal contours are within normal limits.
Both lungs are clear. The visualized skeletal structures are
unremarkable.
IMPRESSION: No active cardiopulmonary disease.

## 2020-02-12 ENCOUNTER — Other Ambulatory Visit: Payer: Self-pay

## 2020-02-12 ENCOUNTER — Encounter (HOSPITAL_BASED_OUTPATIENT_CLINIC_OR_DEPARTMENT_OTHER): Payer: Self-pay | Admitting: *Deleted

## 2020-02-12 ENCOUNTER — Emergency Department (HOSPITAL_BASED_OUTPATIENT_CLINIC_OR_DEPARTMENT_OTHER)
Admission: EM | Admit: 2020-02-12 | Discharge: 2020-02-12 | Disposition: A | Discharge status: Home / Self Care | Payer: Medicaid Other | Attending: Emergency Medicine | Admitting: Emergency Medicine

## 2020-02-12 DIAGNOSIS — Y9389 Activity, other specified: Secondary | ICD-10-CM | POA: Insufficient documentation

## 2020-02-12 DIAGNOSIS — S3992XA Unspecified injury of lower back, initial encounter: Secondary | ICD-10-CM | POA: Diagnosis present

## 2020-02-12 DIAGNOSIS — S29012A Strain of muscle and tendon of back wall of thorax, initial encounter: Secondary | ICD-10-CM | POA: Insufficient documentation

## 2020-02-12 DIAGNOSIS — Z882 Allergy status to sulfonamides status: Secondary | ICD-10-CM | POA: Insufficient documentation

## 2020-02-12 DIAGNOSIS — Z79899 Other long term (current) drug therapy: Secondary | ICD-10-CM | POA: Insufficient documentation

## 2020-02-12 DIAGNOSIS — S39012A Strain of muscle, fascia and tendon of lower back, initial encounter: Secondary | ICD-10-CM | POA: Diagnosis not present

## 2020-02-12 DIAGNOSIS — Y999 Unspecified external cause status: Secondary | ICD-10-CM | POA: Diagnosis not present

## 2020-02-12 DIAGNOSIS — Z87891 Personal history of nicotine dependence: Secondary | ICD-10-CM | POA: Diagnosis not present

## 2020-02-12 DIAGNOSIS — Y92410 Unspecified street and highway as the place of occurrence of the external cause: Secondary | ICD-10-CM | POA: Insufficient documentation

## 2020-02-12 DIAGNOSIS — S46819A Strain of other muscles, fascia and tendons at shoulder and upper arm level, unspecified arm, initial encounter: Secondary | ICD-10-CM

## 2020-02-12 MED ORDER — METHOCARBAMOL 500 MG PO TABS: 500.0000 mg | ORAL_TABLET | Freq: Two times a day (BID) | ORAL | 0 refills | Status: AC

## 2020-02-12 NOTE — Discharge Instructions (Signed)
Please take Tylenol, ibuprofen, Robaxin as prescribed.  Please use Tylenol or ibuprofen for pain.  You may use 600 mg ibuprofen every 6 hours or 1000 mg of Tylenol every 6 hours.  You may choose to alternate between the 2.  This would be most effective.  Not to exceed 4 g of Tylenol within 24 hours.  Not to exceed 3200 mg ibuprofen 24 hours.
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
## 2020-07-11 ENCOUNTER — Encounter (HOSPITAL_BASED_OUTPATIENT_CLINIC_OR_DEPARTMENT_OTHER): Payer: Self-pay | Admitting: Emergency Medicine

## 2020-07-11 ENCOUNTER — Other Ambulatory Visit: Payer: Self-pay

## 2020-07-11 ENCOUNTER — Emergency Department (HOSPITAL_BASED_OUTPATIENT_CLINIC_OR_DEPARTMENT_OTHER)
Admission: EM | Admit: 2020-07-11 | Discharge: 2020-07-11 | Disposition: A | Discharge status: Home / Self Care | Payer: Medicaid Other | Attending: Emergency Medicine | Admitting: Emergency Medicine

## 2020-07-11 DIAGNOSIS — Z87891 Personal history of nicotine dependence: Secondary | ICD-10-CM | POA: Insufficient documentation

## 2020-07-11 DIAGNOSIS — Z79899 Other long term (current) drug therapy: Secondary | ICD-10-CM | POA: Diagnosis not present

## 2020-07-11 DIAGNOSIS — X58XXXA Exposure to other specified factors, initial encounter: Secondary | ICD-10-CM | POA: Diagnosis not present

## 2020-07-11 DIAGNOSIS — S0501XA Injury of conjunctiva and corneal abrasion without foreign body, right eye, initial encounter: Secondary | ICD-10-CM | POA: Diagnosis present

## 2020-07-11 MED ORDER — TETRACAINE HCL 0.5 % OP SOLN
2.0000 [drp] | Freq: Once | OPHTHALMIC | Status: AC
  Administered 2020-07-11: 15:00:00 2 [drp] via OPHTHALMIC
  Filled 2020-07-11: qty 4

## 2020-07-11 MED ORDER — FLUORESCEIN SODIUM 1 MG OP STRP
1.0000 | ORAL_STRIP | Freq: Once | OPHTHALMIC | Status: AC
  Administered 2020-07-11: 15:00:00 1 via OPHTHALMIC
  Filled 2020-07-11: qty 1

## 2020-07-11 MED ORDER — OFLOXACIN 0.3 % OP SOLN: 2.0000 [drp] | Freq: Four times a day (QID) | OPHTHALMIC | 0 refills | Status: AC

## 2020-07-11 NOTE — Discharge Instructions (Signed)
Please use the antibiotic drops, as directed.  Discontinue should you become pregnant or breast-feeding.  Please read the attachment on corneal abrasion.  I recommend over-the-counter lubricant drops.  Please follow-up with ophthalmology if your symptoms have failed to improve despite antibiotic therapy in 3 to 5 days.  Return to the ED or seek immediate medical attention should you experience any new or worsening symptoms.

## 2020-07-11 NOTE — ED Provider Notes (Signed)
[facility] EMERGENCY DEPARTMENT Provider Note   CSN: [id number] Arrival date & time: 07/11/20  1106     History Chief Complaint  Patient presents with  . Eye Problem    Debbie Rojas is a 34 y.o. female with PMH significant for contact lenses who presents the ED with a 1 day history of left eye discomfort.  Patient woke up this morning and her eye was noticeably.  Clear drainage noted.  No precipitating trauma.  She does wear contact lenses and reports that she slept in them last night.  She denies any blurred vision, headache or dizziness, proptosis, pain with EOMs, inability to move her eye, fevers or chills, or other symptoms.  HPI     Past Medical History:  Diagnosis Date  . Anemia   . Anemia   . Anxiety   . Chronic asthmatic bronchitis (HCC)     There are no problems to display for this patient.   Past Surgical History:  Procedure Laterality Date  . TOOTH EXTRACTION       OB History    Gravida  2   Para  1   Term  1   Preterm      AB      Living  1     SAB      TAB      Ectopic      Multiple      Live Births              History reviewed. No pertinent family history.  Social History   Tobacco Use  . Smoking status: Former Games developer  . Smokeless tobacco: Never Used  Substance Use Topics  . Alcohol use: Yes    Comment: occ  . Drug use: No    Home Medications Prior to Admission medications   Medication Sig Start Date End Date Taking? Authorizing Provider  azithromycin (ZITHROMAX Z-PAK) 250 MG tablet 2 po day one, then 1 daily x 4 days 10/15/13   Molpus, John, MD  benzonatate (TESSALON) 100 MG capsule Take 1 capsule (100 mg total) by mouth every 8 (eight) hours. 07/20/17   Maczis, Elmer Sow, PA-C  cetirizine-pseudoephedrine (ZYRTEC-D) 5-120 MG per tablet Take 1 tablet by mouth daily. 06/27/14   Elson Areas, PA-C  chlorpheniramine-HYDROcodone (TUSSIONEX PENNKINETIC ER) 10-8 MG/5ML LQCR Take 5 mLs by mouth every 12  (twelve) hours as needed for cough. 10/15/13   Molpus, John, MD  clonazePAM (KLONOPIN) 1 MG tablet Take 1 mg by mouth 2 (two) times daily.    [provider]  cyclobenzaprine (FLEXERIL) 5 MG tablet Take 5 mg by mouth 3 (three) times daily as needed. For back pain.     [provider]  Guaifenesin 100 MG PACK 200 to 400 mg ORALLY every 4 hours as needed for congestion; MAX 2400 mg/day 07/20/17   Maczis, Elmer Sow, PA-C  lidocaine (XYLOCAINE) 2 % solution Use as directed 15 mLs in the mouth or throat as needed for mouth pain. 07/22/17   McDonald, Mia A, PA-C  methocarbamol (ROBAXIN) 500 MG tablet Take 1 tablet (500 mg total) by mouth 2 (two) times daily. 02/12/20   Gailen Shelter, PA  ofloxacin (OCUFLOX) 0.3 % ophthalmic solution Place 2 drops into the left eye 4 (four) times daily for 5 days. 07/11/20 07/16/20  Lorelee New, PA-C  Prenatal Vit-Fe Fumarate-FA (MULTIVITAMIN-PRENATAL) 27-0.8 MG TABS Take 1 tablet by mouth daily.      [provider]  Allergies    Sulfa antibiotics  Review of Systems   Review of Systems  Constitutional: Negative for fever.  HENT: Negative for facial swelling.   Eyes: Positive for photophobia, pain, discharge and redness. Negative for itching and visual disturbance.    Physical Exam Updated Vital Signs BP 127/89   Pulse (!) 107   Temp (!) 97 F (36.1 C) (Oral)   Resp 18   SpO2 100%   Physical Exam Vitals and nursing note reviewed. Exam conducted with a chaperone present.  Constitutional:      General: She is not in acute distress.    Appearance: Normal appearance. She is not ill-appearing.  HENT:     Head: Normocephalic and atraumatic.  Eyes:     General: No scleral icterus.    Extraocular Movements: Extraocular movements intact.     Pupils: Pupils are equal, round, and reactive to light.     Comments: Right eye: Photophobia.  No consensual photophobia.  EOMs intact.  No pain with EOMs.  No proptosis.  No ptosis.  No  periorbital edema or swelling.  Mild clear watery discharge.  Everted eyelid, no obvious foreign body. Left eye: Normal. Woods lamp exam: Small, mild area of fluorescein uptake.  No ulcerative lesion.  No other deformity or foreign body noted.   Pulmonary:     Effort: Pulmonary effort is normal. No respiratory distress.  Musculoskeletal:     Cervical back: Normal range of motion.  Skin:    General: Skin is dry.  Neurological:     Mental Status: She is alert and oriented to person, place, and time.     GCS: GCS eye subscore is 4. GCS verbal subscore is 5. GCS motor subscore is 6.  Psychiatric:        Mood and Affect: Mood normal.        Behavior: Behavior normal.        Thought Content: Thought content normal.     ED Results / Procedures / Treatments   Labs (all labs ordered are listed, but only abnormal results are displayed) Labs Reviewed - No data to display  EKG None  Radiology No results found.  Procedures Procedures (including critical care time)  Medications Ordered in ED Medications  tetracaine (PONTOCAINE) 0.5 % ophthalmic solution 2 drop (has no administration in time range)  fluorescein ophthalmic strip 1 strip (has no administration in time range)    ED Course  I have reviewed the triage vital signs and the nursing notes.  Pertinent labs & imaging results that were available during my care of the patient were reviewed by me and considered in my medical decision making (see chart for details).    MDM Rules/Calculators/A&P                          Patient presents to the ED with a 1 day history of left eye irritation and erythema.  She denies any blurred vision or other visual deficits.  My physical exam was reassuring.  Her visual acuity is symmetric and largely intact.  She states that her vision is poor and is currently wearing her glasses.  She does endorse wearing contact lenses and even slept in them last evening.  There is a small area of mild  fluorescein uptake.  Negative Seidel sign.  No proptosis or periorbital edema.  No pain with EOMs.  No surrounding cellulitis.   I suspect that patient's photophobia in context of irritated, red  eye with mild fluorescein uptake is consistent with corneal abrasion.  Will treat with fluoroquinolone drops for Pseudomonas coverage.  Will provide referral to ophthalmology if her symptoms fail to improve with conservative therapy.  Recommending oral NSAIDs and sterile over-the-counter ocular lubricants.  Instructions provided.  Strict ED return precautions discussed.  Patient voices understanding and is agreeable to the plan.  She feels much improved after local anesthetic applied in ED.  She will wear sunglasses.  Final Clinical Impression(s) / ED Diagnoses Final diagnoses:  Abrasion of right cornea, initial encounter    Rx / DC Orders ED Discharge Orders         Ordered    ofloxacin (OCUFLOX) 0.3 % ophthalmic solution  4 times daily        07/11/20 1422           Elvera Maria 07/11/20 1424    Jacalyn Lefevre, MD 07/11/20 1527

## 2020-07-11 NOTE — ED Triage Notes (Signed)
Pt here with left eye pain and irritation since this morning.

## 2021-07-17 ENCOUNTER — Emergency Department (HOSPITAL_BASED_OUTPATIENT_CLINIC_OR_DEPARTMENT_OTHER)
Admission: EM | Admit: 2021-07-17 | Discharge: 2021-07-17 | Disposition: A | Discharge status: Home / Self Care | Payer: Medicaid Other | Attending: Emergency Medicine | Admitting: Emergency Medicine

## 2021-07-17 ENCOUNTER — Other Ambulatory Visit: Payer: Self-pay

## 2021-07-17 ENCOUNTER — Encounter (HOSPITAL_BASED_OUTPATIENT_CLINIC_OR_DEPARTMENT_OTHER): Payer: Self-pay

## 2021-07-17 DIAGNOSIS — Z20822 Contact with and (suspected) exposure to covid-19: Secondary | ICD-10-CM | POA: Insufficient documentation

## 2021-07-17 DIAGNOSIS — Z79899 Other long term (current) drug therapy: Secondary | ICD-10-CM | POA: Insufficient documentation

## 2021-07-17 DIAGNOSIS — R197 Diarrhea, unspecified: Secondary | ICD-10-CM | POA: Diagnosis present

## 2021-07-17 DIAGNOSIS — R112 Nausea with vomiting, unspecified: Secondary | ICD-10-CM | POA: Diagnosis not present

## 2021-07-17 DIAGNOSIS — Z87891 Personal history of nicotine dependence: Secondary | ICD-10-CM | POA: Insufficient documentation

## 2021-07-17 LAB — URINALYSIS, ROUTINE W REFLEX MICROSCOPIC
Bilirubin Urine: NEGATIVE
Glucose, UA: NEGATIVE mg/dL
Hgb urine dipstick: NEGATIVE
Ketones, ur: NEGATIVE mg/dL
Nitrite: NEGATIVE
Protein, ur: NEGATIVE mg/dL
Specific Gravity, Urine: 1.025 (ref 1.005–1.030)
pH: 6.5 (ref 5.0–8.0)

## 2021-07-17 LAB — CBC WITH DIFFERENTIAL/PLATELET
Abs Immature Granulocytes: 0.03 10*3/uL (ref 0.00–0.07)
Basophils Absolute: 0.1 10*3/uL (ref 0.0–0.1)
Basophils Relative: 1 %
Eosinophils Absolute: 0.1 10*3/uL (ref 0.0–0.5)
Eosinophils Relative: 1 %
HCT: 39 % (ref 36.0–46.0)
Hemoglobin: 12.6 g/dL (ref 12.0–15.0)
Immature Granulocytes: 0 %
Lymphocytes Relative: 23 %
Lymphs Abs: 1.9 10*3/uL (ref 0.7–4.0)
MCH: 25.5 pg — ABNORMAL LOW (ref 26.0–34.0)
MCHC: 32.3 g/dL (ref 30.0–36.0)
MCV: 78.9 fL — ABNORMAL LOW (ref 80.0–100.0)
Monocytes Absolute: 0.6 10*3/uL (ref 0.1–1.0)
Monocytes Relative: 7 %
Neutro Abs: 5.6 10*3/uL (ref 1.7–7.7)
Neutrophils Relative %: 68 %
Platelets: 314 10*3/uL (ref 150–400)
RBC: 4.94 MIL/uL (ref 3.87–5.11)
RDW: 15.2 % (ref 11.5–15.5)
WBC: 8.3 10*3/uL (ref 4.0–10.5)
nRBC: 0 % (ref 0.0–0.2)

## 2021-07-17 LAB — URINALYSIS, MICROSCOPIC (REFLEX)

## 2021-07-17 LAB — COMPREHENSIVE METABOLIC PANEL
ALT: 38 U/L (ref 0–44)
AST: 32 U/L (ref 15–41)
Albumin: 4.4 g/dL (ref 3.5–5.0)
Alkaline Phosphatase: 63 U/L (ref 38–126)
Anion gap: 5 (ref 5–15)
BUN: 12 mg/dL (ref 6–20)
CO2: 25 mmol/L (ref 22–32)
Calcium: 8.9 mg/dL (ref 8.9–10.3)
Chloride: 105 mmol/L (ref 98–111)
Creatinine, Ser: 0.95 mg/dL (ref 0.44–1.00)
GFR, Estimated: >60 mL/min (ref >60)
Glucose, Bld: 104 mg/dL — ABNORMAL HIGH (ref 70–99)
Potassium: 4.1 mmol/L (ref 3.5–5.1)
Sodium: 135 mmol/L (ref 135–145)
Total Bilirubin: 0.3 mg/dL (ref 0.3–1.2)
Total Protein: 7.9 g/dL (ref 6.5–8.1)

## 2021-07-17 LAB — PREGNANCY, URINE: Preg Test, Ur: NEGATIVE

## 2021-07-17 LAB — LIPASE, BLOOD: Lipase: 34 U/L (ref 11–51)

## 2021-07-17 NOTE — ED Triage Notes (Signed)
Nausea, vomiting, watery diarrhea since Tuesday, body aches, "stomach feels raw".  No known sick contacts.

## 2021-07-17 NOTE — Discharge Instructions (Addendum)
You may utilize the materials for a stool collection if you see fit.  You may return this to your primary care office if that is easier.  I believe your illness to be improving and no treatment is necessary at this time.  If you continue to spike fevers or notice blood in your vomit or stool you should return to the emergency department.  Try and escalate your diet to bread, rice and other bulkier foods.  It was a pleasure to meet you and I hope that you feel better.

## 2021-07-17 NOTE — ED Notes (Signed)
Pt provided specimen cup, bag and hat for stool collection at home per PA request

## 2021-07-17 NOTE — ED Provider Notes (Signed)
[facility] EMERGENCY DEPARTMENT Provider Note   CSN: [id number] Arrival date & time: 07/17/21  1050     History Chief Complaint  Patient presents with   Emesis   Nausea    Debbie Rojas is a 35 y.o. female presenting today with a complaint of diarrhea and vomiting since Tuesday.  Patient reports that on Monday night she was going to the bathroom more often and on Tuesday she began having watery diarrhea.  Tuesday she had incontinence of liquidy diarrhea at work.  Wednesday she vomited for the first time.  Denies hematochezia or hematemesis.  No recent travel, recent antibiotic use, recent changes in diet or other sick contacts.  Has not utilized anything at home.  Is still tolerating food and drink.  Subjective fevers.  Began to have some solid stools on Thursday.  Feels as though she is getting better.  Does not get menstrual periods due to IUD.  Has not taken an at home test for COVID-19.  No history of IBS/IBD.   Past Medical History:  Diagnosis Date   Anemia    Anemia    Anxiety    Chronic asthmatic bronchitis (HCC)     There are no problems to display for this patient.   Past Surgical History:  Procedure Laterality Date   TOOTH EXTRACTION       OB History     Gravida  2   Para  1   Term  1   Preterm      AB      Living  1      SAB      IAB      Ectopic      Multiple      Live Births              History reviewed. No pertinent family history.  Social History   Tobacco Use   Smoking status: Former   Smokeless tobacco: Never  Substance Use Topics   Alcohol use: Yes    Comment: occ   Drug use: No    Home Medications Prior to Admission medications   Medication Sig Start Date End Date Taking? Authorizing Provider  azithromycin (ZITHROMAX Z-PAK) 250 MG tablet 2 po day one, then 1 daily x 4 days 10/15/13   Molpus, John, MD  benzonatate (TESSALON) 100 MG capsule Take 1 capsule (100 mg total) by mouth every 8 (eight) hours.  07/20/17   Maczis, Elmer Sow, PA-C  cetirizine-pseudoephedrine (ZYRTEC-D) 5-120 MG per tablet Take 1 tablet by mouth daily. 06/27/14   Elson Areas, PA-C  chlorpheniramine-HYDROcodone (TUSSIONEX PENNKINETIC ER) 10-8 MG/5ML LQCR Take 5 mLs by mouth every 12 (twelve) hours as needed for cough. 10/15/13   Molpus, John, MD  clonazePAM (KLONOPIN) 1 MG tablet Take 1 mg by mouth 2 (two) times daily.    [provider]  cyclobenzaprine (FLEXERIL) 5 MG tablet Take 5 mg by mouth 3 (three) times daily as needed. For back pain.     [provider]  Guaifenesin 100 MG PACK 200 to 400 mg ORALLY every 4 hours as needed for congestion; MAX 2400 mg/day 07/20/17   Maczis, Elmer Sow, PA-C  lidocaine (XYLOCAINE) 2 % solution Use as directed 15 mLs in the mouth or throat as needed for mouth pain. 07/22/17   McDonald, Mia A, PA-C  methocarbamol (ROBAXIN) 500 MG tablet Take 1 tablet (500 mg total) by mouth 2 (two) times daily. 02/12/20   Gailen Shelter, PA  Prenatal Vit-Fe Fumarate-FA (MULTIVITAMIN-PRENATAL) 27-0.8 MG TABS Take 1 tablet by mouth daily.      [provider]    Allergies    Sulfa antibiotics  Review of Systems   Review of Systems  Constitutional:  Positive for fever. Negative for chills.  Gastrointestinal:  Positive for diarrhea, nausea and vomiting. Negative for abdominal pain and constipation.  Genitourinary:  Negative for difficulty urinating, dysuria and hematuria.  Skin:  Negative for pallor.  Neurological:  Negative for dizziness and light-headedness.  All other systems reviewed and are negative.  Physical Exam Updated Vital Signs BP 118/89 (BP Location: Left Arm)   Pulse 82   Temp 98.5 F (36.9 C) (Oral)   Resp 18   Ht 5\' 5"  (1.651 m)   Wt 98 kg   SpO2 100%   BMI 35.94 kg/m   Physical Exam Vitals and nursing note reviewed.  Constitutional:      Appearance: Normal appearance.  HENT:     Head: Normocephalic and atraumatic.     Mouth/Throat:      Mouth: Mucous membranes are moist.     Pharynx: Oropharynx is clear.  Eyes:     General: No scleral icterus.    Conjunctiva/sclera: Conjunctivae normal.  Pulmonary:     Effort: Pulmonary effort is normal. No respiratory distress.  Abdominal:     General: Abdomen is flat.     Palpations: Abdomen is soft.     Tenderness: There is no abdominal tenderness.  Skin:    General: Skin is warm and dry.     Findings: No rash.     Comments: Does not appear fluid depleted  Neurological:     Mental Status: She is alert.  Psychiatric:        Mood and Affect: Mood normal.        Behavior: Behavior normal.    ED Results / Procedures / Treatments   Labs (all labs ordered are listed, but only abnormal results are displayed) Labs Reviewed  URINALYSIS, ROUTINE W REFLEX MICROSCOPIC - Abnormal; Notable for the following components:      Result Value   Leukocytes,Ua SMALL (*)    All other components within normal limits  CBC WITH DIFFERENTIAL/PLATELET - Abnormal; Notable for the following components:   MCV 78.9 (*)    MCH 25.5 (*)    All other components within normal limits  COMPREHENSIVE METABOLIC PANEL - Abnormal; Notable for the following components:   Glucose, Bld 104 (*)    All other components within normal limits  URINALYSIS, MICROSCOPIC (REFLEX) - Abnormal; Notable for the following components:   Bacteria, UA MANY (*)    All other components within normal limits  SARS CORONAVIRUS 2 (TAT 6-24 HRS)  GASTROINTESTINAL PANEL BY PCR, STOOL (REPLACES STOOL CULTURE)  PREGNANCY, URINE  LIPASE, BLOOD    EKG None  Radiology No results found.  Procedures Procedures   Medications Ordered in ED Medications - No data to display  ED Course  I have reviewed the triage vital signs and the nursing notes.  Pertinent labs & imaging results that were available during my care of the patient were reviewed by me and considered in my medical decision making (see chart for details).    MDM  Rules/Calculators/A&P  Patient evaluated by me at bedside.  She reported that she did not have any symptoms in this moment however reports that her stomach feels "raw."  She is still tolerating oral intake.  No blood in her stool or vomit.  Blood work without abnormalities.  Urine sample with some bacteria and leukocytes however patient is asymptomatic of a urinary tract infection and any antibiotic treatment may make her GI illness worse.  She does not have risk factors for C. difficile and I have a low suspicion for infectious diarrhea.  I believe the patient to be suffering from a viral gastroenteritis.  Potentially side effects of COVID-19, test still pending.  Because she has had some subjective fevers I will supply her with a hat and cup for stool sample.  She may bring this back to a [facility] health emergency department, urgent care or primary care office.  Because her symptoms are resolving and she is tolerating p.o. intake I do not believe that she needs intravenous hydration.  Patient to be discharged with a work note and strict return precautions.  Patient agreeable to this plan.  Final Clinical Impression(s) / ED Diagnoses Final diagnoses:  None    Rx / DC Orders Results and diagnoses were explained to the patient. Return precautions discussed in full. Patient had no additional questions and expressed complete understanding.     Saddie Benders, PA-C 07/17/21 1342    Gloris Manchester, MD 07/18/21 [phone]

## 2021-07-18 LAB — SARS CORONAVIRUS 2 (TAT 6-24 HRS): SARS Coronavirus 2: NEGATIVE

## 2023-12-21 NOTE — Progress Notes (Signed)
 Assessment and Plan   1. Acne, unspecified acne type      2. Weight gain  TSH Rfx on Abnormal to Free T4   TSH Rfx on Abnormal to Free T4    3. Obesity (BMI 30.0-34.9)  semaglutide,0.25 or 0.5 mg/dose, (OZEMPIC) 2 mg/3 mL SOPN pen injection    4. Type 2 diabetes mellitus without complication, without long-term current use of insulin  (*)  semaglutide,0.25 or 0.5 mg/dose, (OZEMPIC) 2 mg/3 mL SOPN pen injection      Assessment & Plan 1. Acne. Her acne may worsen before improving due to hormonal fluctuations following the removal of her contraceptive device.  We did discuss different treatment options over-the-counter and she would like to initiate Differin gel topically as spot treatment.  The use of pimple patches has also been recommended. She has been advised to apply the Differin gel first, allow it to dry, and then apply the pimple patch. She has been informed that the next few months may be challenging as her body adjusts to the hormonal changes. If the Differin gel is not effective or causes skin irritation, a prescription for a topical antibiotic and a prescription-strength benzoyl peroxide will be considered.  2. Weight management/type 2 diabetes/obesity. TSH pending.  Her Body Mass Index (BMI) is currently 32.79. She has been informed about the potential risks associated with GLP-1 medications. A prescription for Ozempic 0.25 mg has been provided, to be administered once weekly for a period of four weeks. Following this, the dosage will be increased to 0.5 mg, and if well-tolerated, will be further increased to 1 mg after another month.  Continue caloric monitoring and avoid diet high in sugar or carbohydrates.    Risks, benefits, and alternatives of the medications and treatment plan prescribed today were discussed, and patient expressed understanding. Plan follow-up as discussed or as needed if any worsening symptoms or change in condition.    Computer technology was used to  create visit note. Consent from the patient/care giver was obtained prior to use.   Patient's Medications       * Accurate as of December 21, 2023  3:50 PM. Reflects encounter med changes as of last refresh          New Prescriptions      Instructions  semaglutide(0.25 or 0.5 mg/dose) 2 mg/3 mL Sopn pen injection Commonly known as: OZEMPIC Started by: Rocky Burow  Inject 0.25 mg weekly x 4 weeks.       Continued Medications      Instructions  albuterol  sulfate HFA 108 (90 Base) MCG/ACT inhaler Commonly known as: PROVENTIL ,VENTOLIN ,PROAIR   2 puffs, Inhalation, Every 6 hours as needed, INHALE 2 PUFFS INTO THE LUNGS EVERY 4 HOURS AS NEEDED FOR COUGH   atomoxetine 40 mg capsule Commonly known as: STRATTERA  40 mg, Oral, 2 times a day   citalopram 40 mg tablet Commonly known as: CELEXA  40 mg, Oral, Every evening   lamoTRIgine 200 MG tablet Commonly known as: LAMICTAL  200 mg, Oral, Daily   levonorgestrel IUD Commonly known as: MIRENA  1 each, Intrauterine, Once   lumateperone 42 mg Caps Commonly known as: CAPLYTA  42 mg, Oral, Daily   rosuvastatin calcium 5 mg tablet Commonly known as: CRESTOR  5 mg, Oral, 3 times weekly   tiZANidine 4 mg tablet Commonly known as: ZANAFLEX  TAKE 1 TABLET(4 MG) BY MOUTH TWICE DAILY AS NEEDED  Subjective  Patient ID:  Debbie Rojas is a 38 y.o. (DOB August 05, 1986) female.   presents with:     Patient presents with  . Acne  . Obesity    Lachrisha presents today to discuss acne and weight.    Chief complaint and clinical team member comments reviewed and verified with patient.  History of Present Illness The patient presents for evaluation of acne, weight management, and diabetes.  She reports that for a while now she has been struggling with isolated patches of acne on her face.  She did recently have her IUD removed at the end of last month. She has not altered her skincare routine or makeup  brands. The acne is not cystic in nature but persists for an extended period before developing a head. She maintains good hygiene practices, including regular washing of her pillowcases. Her skin type is combination, with the T-zone being either excessively oily or dry. She refrains from picking at her skin. She has not used any specific acne treatments, relying on her longstanding skincare regimen, and is hesitant to try new products due to fear of exacerbating the condition.  She has been struggling with weight gain despite maintaining a healthy diet. She had previously lost weight down to 170 pounds through a regimen of one meal per day, but found this unsustainable and has since incorporated healthy snacks into her diet. She works 40 hours per week in a physically demanding job at [employer], which involves standing and moving for 8 hours. She does not consume junk food or sugary foods.  She does utilize caloric monitoring and is typically getting approximately 1400 cal/day.  She does not engage in regular physical activity due to busy lifestyle.  She did look into treatments like Wegovy or other GLP-1 medications for weight loss and states that her insurance appears to cover these with a BMI over 30.  She at one point was considering bariatric surgery.  Her hemoglobin A1c 1 year ago was 6.5 and diagnostic for diabetes however she has been diet controlled.    Reviewed and updated this visit by provider: Tobacco  Allergies  Meds  Problems  Med Hx  Surg Hx  Fam Hx        Past Medical History, Past Surgery History, Allergies, Social History, and Family History was reviewed and updated.   Past Medical History:  Diagnosis Date  . Anxiety   . Carpal tunnel syndrome of left wrist   . Depression    Review of Systems is complete and negative except as noted.      Objective   Vitals:   12/21/23 1446  BP: 130/84  Patient Position: Sitting  Pulse: 114  Resp: 16  Height: 5' 4.5  (1.638 m)  Weight: 194 lb (88 kg)  SpO2: 98%  BMI (Calculated): 32.8  PainSc: 0-No pain     General:  Well developed, Well nourished, No distress HEENT: Normocephalic, atraumatic Neck:  Supple with normal range of motion; No lymphadenopathy  CV:  S1S2, Regular rate and rhythm Lungs: No shortness of breath or wheezing.  Clear. Skin: Superficial acne noted around the temples and chin Neuro:  No focal deficits     *Some images could not be shown.

## 2024-04-25 NOTE — Progress Notes (Signed)
 Initial Clinic Visit  Referring Provider Charmaine Ates, FNP   Reason for consult:  1. DOE (dyspnea on exertion)   2. Palpitations   3. Mixed hyperlipidemia   4. Attention deficit hyperactivity disorder (ADHD), predominantly inattentive type   5. Iron deficiency     Chronic Problem List:  Patient Active Problem List  Diagnosis  . GAD (generalized anxiety disorder)  . Bipolar 2 disorder (*)  . High risk medications (not anticoagulants) long-term use  . Bilateral hearing loss  . Class 3 severe obesity with body mass index (BMI) of 40.0 to 44.9 in adult  . Insomnia related to another mental disorder  . Mixed hyperlipidemia  . Fatty liver  . PTSD (post-traumatic stress disorder)  . Right carpal tunnel syndrome  . Left carpal tunnel syndrome  . Vitamin D deficiency  . Diet-controlled diabetes mellitus (*)  . Attention deficit hyperactivity disorder (ADHD), predominantly inattentive type  . Borderline personality traits    History of Present Illness Debbie Rojas is 38 y.o. year old female with a history of diabetes mellitus, hyperlipidemia, bipolar 2 disorder, anxiety and sleeping disorder who presents to [facility] Cardiology EP clinic in [facility] for evaluation of exertional intolerance. She also has chronically low iron level in the 7-8% range.  She has not been consistent with his iron supplementation. Denies any bleeding diathesis. She has lost about 60 pounds in the past year after changing her diet. She used to smoke cigarettes but quit many years ago. Reports family history of TIA/stroke. She has been referred for sleep study.  Regarding symptoms, patient has noticed fatigue and dyspnea with very mild activity. Sometimes she notices rapid heart action but denies dizziness or syncope. Denies orthopnea, pedal edema or PND. She is agreeable to proceed with stress echocardiogram to exclude ischemia and to evaluate LVEF and intracardiac structure. A 2-week Holter monitor will be  applied to exclude cardiac arrhythmia.   Social History:  Patient  reports that she has quit smoking. Her smoking use included cigarettes. She has a 0.5 pack-year smoking history. She has never used smokeless tobacco. She reports that she does not currently use alcohol after a past usage of about 4.0 standard drinks of alcohol per week. She reports current drug use. Drugs: Marijuana and Other-see comments.  Family History:   Patient BRCA related family history is not on file.   Medications  Current Outpatient Medications:  .  albuterol  sulfate HFA (PROVENTIL ,VENTOLIN ,PROAIR ) 108 (90 Base) MCG/ACT inhaler, Inhale two puffs into the lungs every 6 (six) hours as needed for Wheezing. INHALE 2 PUFFS INTO THE LUNGS EVERY 4 HOURS AS NEEDED FOR COUGH, Disp: 8.5 g, Rfl: 1 .  atomoxetine (STRATTERA) 40 mg capsule, Take one capsule (40 mg dose) by mouth after lunch. (In addition to 60 mg in the morning), Disp: 30 capsule, Rfl: 1 .  atomoxetine (STRATTERA) 60 mg capsule, Take one capsule (60 mg dose) by mouth every morning., Disp: , Rfl:  .  buPROPion (WELLBUTRIN) 75 mg tablet, Take one tablet (75 mg dose) by mouth 2 (two) times daily., Disp: 60 tablet, Rfl: 1 .  citalopram (CELEXA) 40 mg tablet, Take one tablet (40 mg dose) by mouth every evening., Disp: 90 tablet, Rfl: 1 .  lamoTRIgine (LAMICTAL) 150 mg tablet, Take one tablet (150 mg dose) by mouth daily., Disp: 90 tablet, Rfl: 0 .  lumateperone (CAPLYTA) 42 mg CAPS, Take one capsule (42 mg dose) by mouth daily., Disp: 30 capsule, Rfl: 5 .  tiZANidine (ZANAFLEX) 4  mg tablet, Take one tablet (4 mg dose) by mouth every 8 (eight) hours as needed for up to 10 days., Disp: 30 tablet, Rfl: 0  Review of Systems Pertinent items are noted in HPI.  Physical Examination Vitals:   04/25/24 1311  BP: 126/78  Patient Position: Sitting  Pulse: 79  SpO2: 98%  Weight: 183 lb 9.6 oz (83.3 kg)  Height: 5' 4.5 (1.638 m)   General - Pt. in NAD, normal demeanor,  speech normal, oriented normally HEENT - PERRLA, EOMs intact, nares and throat clear Neck -  neither supple nor no adenopathy nor thyroid normal in size, no nodules or tenderness nor carotids normal upstroke, no bruits Lungs - chest clear, no wheezing, rales, normal symmetric air entry, no chest wall deformities or tenderness Heart - regular rate and rhythm, S1, S2 normal, no murmur, click, rub or gallop Abdomen - soft, bowel sounds positive, no organomegaly Extrem - none Skin - no rash, bruising or petechia Back - no CVA tenderness Neuro - strength is symmetric in upper and lower extremities; reflexes normal, speech normal, face symmetric Psych - affect appropriate  Accessory Clinical Data  Lab Results  Component Value Date   Glucose 96 04/04/2024   CALCIUM 9.9 04/04/2024   Sodium 141 04/04/2024   Potassium 4.2 04/04/2024   CO2 18 (L) 04/04/2024   Chloride 105 04/04/2024   BUN 12 04/04/2024   Creatinine 0.99 04/04/2024   Lab Results  Component Value Date   WBC 9.0 04/04/2024   Hemoglobin 12.5 04/04/2024   Hematocrit 41.1 04/04/2024   MCV 80 04/04/2024   Platelet Count 326 04/04/2024   No results found for: CK, TROPONIN No results found for: INR, PROTIME  Impression and Plan: 1. DOE (dyspnea on exertion) (Primary) -     Echo Stress Exercise W Dop Comp and Clr Flw WO Enh Agnt; Future 2. Palpitations -     Long Term Holter (8-15 Days) Recording Only; Future -     Long Term Holter (8-15 Days) Interp Only; Future 3. Mixed hyperlipidemia 4. Attention deficit hyperactivity disorder (ADHD), predominantly inattentive type 5. Iron deficiency - Adherence to iron supplementation - Proceed with sleep study as planned - Patient is advised to visit ED or contact this clinic should any significant or worsening cardiac symptoms occur before next visit.    Follow up visit:  2 months   Thank you for allowing us  to participate in the care of this patient.    Darliss Raymond, PA-C [facility]

## 2024-05-14 ENCOUNTER — Encounter (HOSPITAL_BASED_OUTPATIENT_CLINIC_OR_DEPARTMENT_OTHER): Payer: Self-pay

## 2024-05-14 ENCOUNTER — Emergency Department (HOSPITAL_BASED_OUTPATIENT_CLINIC_OR_DEPARTMENT_OTHER): Payer: MEDICAID

## 2024-05-14 ENCOUNTER — Emergency Department (HOSPITAL_BASED_OUTPATIENT_CLINIC_OR_DEPARTMENT_OTHER)
Admission: EM | Admit: 2024-05-14 | Discharge: 2024-05-14 | Disposition: A | Discharge status: Home / Self Care | Payer: MEDICAID | Attending: Emergency Medicine | Admitting: Emergency Medicine

## 2024-05-14 ENCOUNTER — Other Ambulatory Visit: Payer: Self-pay

## 2024-05-14 DIAGNOSIS — K0889 Other specified disorders of teeth and supporting structures: Secondary | ICD-10-CM | POA: Insufficient documentation

## 2024-05-14 DIAGNOSIS — R5383 Other fatigue: Secondary | ICD-10-CM | POA: Diagnosis not present

## 2024-05-14 LAB — URINALYSIS, MICROSCOPIC (REFLEX)

## 2024-05-14 LAB — COMPREHENSIVE METABOLIC PANEL WITH GFR
ALT: 216 U/L — ABNORMAL HIGH (ref 0–44)
AST: 92 U/L — ABNORMAL HIGH (ref 15–41)
Albumin: 4.5 g/dL (ref 3.5–5.0)
Alkaline Phosphatase: 59 U/L (ref 38–126)
Anion gap: 12 (ref 5–15)
BUN: 10 mg/dL (ref 6–20)
CO2: 20 mmol/L — ABNORMAL LOW (ref 22–32)
Calcium: 9 mg/dL (ref 8.9–10.3)
Chloride: 107 mmol/L (ref 98–111)
Creatinine, Ser: 0.88 mg/dL (ref 0.44–1.00)
GFR, Estimated: >60 mL/min (ref >60)
Glucose, Bld: 101 mg/dL — ABNORMAL HIGH (ref 70–99)
Potassium: 3.6 mmol/L (ref 3.5–5.1)
Sodium: 139 mmol/L (ref 135–145)
Total Bilirubin: 0.2 mg/dL (ref 0.0–1.2)
Total Protein: 7.1 g/dL (ref 6.5–8.1)

## 2024-05-14 LAB — CBC WITH DIFFERENTIAL/PLATELET
Abs Immature Granulocytes: 0.02 K/uL (ref 0.00–0.07)
Basophils Absolute: 0.1 K/uL (ref 0.0–0.1)
Basophils Relative: 1 %
Eosinophils Absolute: 0.1 K/uL (ref 0.0–0.5)
Eosinophils Relative: 1 %
HCT: 34.3 % — ABNORMAL LOW (ref 36.0–46.0)
Hemoglobin: 11.2 g/dL — ABNORMAL LOW (ref 12.0–15.0)
Immature Granulocytes: 0 %
Lymphocytes Relative: 14 %
Lymphs Abs: 1.3 K/uL (ref 0.7–4.0)
MCH: 26.3 pg (ref 26.0–34.0)
MCHC: 32.7 g/dL (ref 30.0–36.0)
MCV: 80.5 fL (ref 80.0–100.0)
Monocytes Absolute: 0.6 K/uL (ref 0.1–1.0)
Monocytes Relative: 7 %
Neutro Abs: 7.5 K/uL (ref 1.7–7.7)
Neutrophils Relative %: 77 %
Platelets: 296 K/uL (ref 150–400)
RBC: 4.26 MIL/uL (ref 3.87–5.11)
RDW: 18.5 % — ABNORMAL HIGH (ref 11.5–15.5)
WBC: 9.7 K/uL (ref 4.0–10.5)
nRBC: 0 % (ref 0.0–0.2)

## 2024-05-14 LAB — URINALYSIS, ROUTINE W REFLEX MICROSCOPIC
Bilirubin Urine: NEGATIVE
Glucose, UA: NEGATIVE mg/dL
Ketones, ur: NEGATIVE mg/dL
Leukocytes,Ua: NEGATIVE
Nitrite: NEGATIVE
Protein, ur: NEGATIVE mg/dL
Specific Gravity, Urine: >=1.03 (ref 1.005–1.030)
pH: 6 (ref 5.0–8.0)

## 2024-05-14 LAB — PREGNANCY, URINE: Preg Test, Ur: NEGATIVE

## 2024-05-14 LAB — CBG MONITORING, ED: Glucose-Capillary: 98 mg/dL (ref 70–99)

## 2024-05-14 MED ORDER — IBUPROFEN 600 MG PO TABS: 600.0000 mg | ORAL_TABLET | Freq: Four times a day (QID) | ORAL | 0 refills | Status: AC | PRN

## 2024-05-14 MED ORDER — OXYCODONE-ACETAMINOPHEN 5-325 MG PO TABS: 1.0000 | ORAL_TABLET | Freq: Four times a day (QID) | ORAL | 0 refills | Status: AC | PRN

## 2024-05-14 NOTE — ED Provider Notes (Signed)
 [place] [facility] Provider Note   CSN: [id number] Arrival date & time: 05/14/24  1544     Patient presents with: Dental Pain and Fatigue   Debbie Rojas is a 38 y.o. female.   Patient to ED with post-extraction dental pain x 2 weeks. She notes 2 lower left molars and one upper right molar pulled the same day and she has had severe pain since. Minimal facial swelling, no fever. No difficulty swallowing. She reports vomiting this morning. She has been unable to advance her diet beyond soft foods since the surgery. She also reports recent diagnosis of low iron. She feels excessively fatigued and tired.  The history is provided by the patient. No language interpreter was used.  Dental Pain      Prior to Admission medications   Medication Sig Start Date End Date Taking? Authorizing Provider  ibuprofen  (ADVIL ) 600 MG tablet Take 1 tablet (600 mg total) by mouth every 6 (six) hours as needed. 05/14/24  Yes Markees Carns, Margit, PA-C  oxyCODONE -acetaminophen  (PERCOCET/ROXICET) 5-325 MG tablet Take 1 tablet by mouth every 6 (six) hours as needed for severe pain (pain score 7-10). 05/14/24  Yes Kiaira Pointer, PA-C  azithromycin  (ZITHROMAX  Z-PAK) 250 MG tablet 2 po day one, then 1 daily x 4 days 10/15/13   Molpus, John, MD  benzonatate  (TESSALON ) 100 MG capsule Take 1 capsule (100 mg total) by mouth every 8 (eight) hours. 07/20/17   Maczis, Michael M, PA-C  cetirizine -pseudoephedrine  (ZYRTEC -D) 5-120 MG per tablet Take 1 tablet by mouth daily. 06/27/14   Sofia, Leslie K, PA-C  chlorpheniramine-HYDROcodone (TUSSIONEX PENNKINETIC ER) 10-8 MG/5ML LQCR Take 5 mLs by mouth every 12 (twelve) hours as needed for cough. 10/15/13   Molpus, John, MD  clonazePAM (KLONOPIN) 1 MG tablet Take 1 mg by mouth 2 (two) times daily.    [provider]  cyclobenzaprine (FLEXERIL) 5 MG tablet Take 5 mg by mouth 3 (three) times daily as needed. For back pain.     [provider]  Guaifenesin  100 MG PACK 200 to 400 mg ORALLY every 4 hours as needed for congestion; MAX 2400 mg/day 07/20/17   Maczis, Michael M, PA-C  lidocaine  (XYLOCAINE ) 2 % solution Use as directed 15 mLs in the mouth or throat as needed for mouth pain. 07/22/17   McDonald, Mia A, PA-C  methocarbamol  (ROBAXIN ) 500 MG tablet Take 1 tablet (500 mg total) by mouth 2 (two) times daily. 02/12/20   Neldon Hamp RAMAN, PA  Prenatal Vit-Fe Fumarate-FA (MULTIVITAMIN-PRENATAL) 27-0.8 MG TABS Take 1 tablet by mouth daily.      [provider]    Allergies: Sulfa antibiotics    Review of Systems  Updated Vital Signs BP 109/61   Pulse 72   Temp 98.9 F (37.2 C) (Oral)   Resp (!) 29   SpO2 99%   Physical Exam Vitals and nursing note reviewed.  Constitutional:      General: She is not in acute distress.    Appearance: She is well-developed. She is not ill-appearing.  HENT:     Mouth/Throat:     Comments: Post-extraction sites are unremarkable in appearance. No swelling or redness. No bleeding or drainage. No open sockets. No facial swelling. Oropharynx is benign.  Cardiovascular:     Rate and Rhythm: Normal rate.  Pulmonary:     Effort: Pulmonary effort is normal.  Musculoskeletal:        General: Normal range of motion.  Cervical back: Normal range of motion.  Skin:    General: Skin is warm and dry.  Neurological:     Mental Status: She is alert and oriented to person, place, and time.     (all labs ordered are listed, but only abnormal results are displayed) Labs Reviewed  CBC WITH DIFFERENTIAL/PLATELET - Abnormal; Notable for the following components:      Result Value   Hemoglobin 11.2 (*)    HCT 34.3 (*)    RDW 18.5 (*)    All other components within normal limits  COMPREHENSIVE METABOLIC PANEL WITH GFR - Abnormal; Notable for the following components:   CO2 20 (*)    Glucose, Bld 101 (*)    AST 92 (*)    ALT 216 (*)    All other components within normal  limits  URINALYSIS, ROUTINE W REFLEX MICROSCOPIC - Abnormal; Notable for the following components:   Hgb urine dipstick MODERATE (*)    All other components within normal limits  URINALYSIS, MICROSCOPIC (REFLEX) - Abnormal; Notable for the following components:   Bacteria, UA RARE (*)    All other components within normal limits  PREGNANCY, URINE  CBG MONITORING, ED    EKG: EKG Interpretation Date/Time:  Tuesday May 14 2024 16:08:45 EDT Ventricular Rate:  94 PR Interval:  148 QRS Duration:  86 QT Interval:  374 QTC Calculation: 468 R Axis:   -16  Text Interpretation: Sinus rhythm Borderline left axis deviation Borderline T abnormalities, lateral leads Confirmed by Yolande Charleston [phone]) on 05/14/2024 4:24:24 PM  Radiology: CT Maxillofacial Wo Contrast Result Date: 05/14/2024 CLINICAL DATA:  Dental pain, fatigue.  Dental surgery 2 weeks ago. EXAM: CT MAXILLOFACIAL WITHOUT CONTRAST TECHNIQUE: Multidetector CT imaging of the maxillofacial structures was performed. Multiplanar CT image reconstructions were also generated. RADIATION DOSE REDUCTION: This exam was performed according to the departmental dose-optimization program which includes automated exposure control, adjustment of the mA and/or kV according to patient size and/or use of iterative reconstruction technique. COMPARISON:  None Available. FINDINGS: Osseous: There appears to been dental extractions of the posterior upper right molars (teeth 1-2) and posterior lower left molars (teeth 17-19). Small amount of gas is seen within the sockets in the lower left mandible which could reflect dry sockets. No mandibular fracture. No facial fracture. Orbits: Negative. No traumatic or inflammatory finding. Sinuses: Rounded soft tissue in the floor the right maxillary sinus. No air-fluid levels. Soft tissues: Negative. No soft tissue fluid collection to suggest abscess. Limited intracranial: No significant or unexpected finding. IMPRESSION:  Teeth extraction as above. Small amount of air within the sockets in the posterior left mandible region could reflect dry sockets. No soft tissue fluid collection to suggest abscess. No acute bony abnormality. Electronically Signed   By: Franky Crease M.D.   On: 05/14/2024 17:17     Procedures   Medications Ordered in the ED - No data to display  Clinical Course as of 05/14/24 1744  Tue May 14, 2024  1742 Patient with post-extraction dental pain. No fever. She has been on Augmentin. No facial swelling. Pending f/u with her dentist. She also reports iron deficiency and has been feeling fatigued. Labs today are reassuring. PCP follow up recommended for iron studies and management. Dental follow up as planned. Will Rx Percocet for pain #10.  [SU]    Clinical Course User Index [SU] Odell Balls, PA-C  Medical Decision Making Amount and/or Complexity of Data Reviewed Labs: ordered. Radiology: ordered.        Final diagnoses:  Pain, dental  Other fatigue    ED Discharge Orders          Ordered    oxyCODONE -acetaminophen  (PERCOCET/ROXICET) 5-325 MG tablet  Every 6 hours PRN        05/14/24 1739    ibuprofen  (ADVIL ) 600 MG tablet  Every 6 hours PRN        05/14/24 1739               Odell Balls, PA-C 05/14/24 1744    Yolande Lamar BROCKS, MD 05/14/24 [phone]

## 2024-05-14 NOTE — ED Notes (Signed)
 Pt. Is here with multiple complaints and no specific issue.  Pt. Speaks clearly with no skin issues noted.  Pt. Is alert and oriented.

## 2024-05-14 NOTE — Discharge Instructions (Signed)
 Follow up with your dentist for further evaluation of post-extraction pain. Take Percocet as directed for severe pain and ibuprofen  every 6 hours regularly.   As we discussed, your maximum dose of Tylenol  for all sources is 4000 mg per 24 hour period.

## 2024-05-14 NOTE — ED Triage Notes (Signed)
 Pt reports bilateral dental pain. Pt states that had dental surgery 2 weeks ago. Also reports that she has been diagnose with low iron/ hemoglobin is normal. Endorses fatigue, dizziness

## 2024-05-14 NOTE — ED Notes (Signed)
 Pt CBG 98. Rn notified.

## 2024-08-15 NOTE — Progress Notes (Signed)
 Patient ID:  Debbie Rojas is a 38 y.o. (DOB 03-26-1986) female.  Assessment and Plan   1. 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 Other iron deficiency anemia     Assessment & Plan 1. Back pain: - Persistent middle and side back pain, unrelieved by OTC patches and analgesics. - Continue Biofreeze, Icy Hot patches, ibuprofen , Tylenol . - Patient has used tizanidine with success in the past, will send into pharmacy for as needed use.  Discussed that medication can cause some sedation/drowsiness, do not combine with any other sedating medications or alcohol.  Do not drive while taking this medication.  Patient verbalized understanding.  2. Sleep disturbances: - Difficulty maintaining sleep with current melatonin dosage. - Adjust melatonin to 12.5 mg to balance efficacy and morning drowsiness. - Suggest magnesium for sleep support.  3. Iron deficiency: - Occasional dizziness and shortness of breath, overall feeling okay. -Has been evaluated by hematology. - Scheduled 6-week iron infusion checkup on 08/21/2024.   Results  Discussed close monitoring for any new or worsening symptoms. Discussed signs and symptoms that would warrant immediate follow-up.   Patient understands and agrees with plan. Computer technology was used to create visit note. Consent from the patient/caregiver was obtained prior to use.  Follow up if symptoms worsen or fail to improve.    Patient's Medications       * Accurate as of August 15, 2024 12:39 PM. Reflects encounter med changes as of last refresh          New Prescriptions      Instructions  tiZANidine 4 mg tablet Commonly known as: ZANAFLEX Started by: Charmaine Ates, FNP  4 mg, Oral, Every 8 hours as needed       Continued Medications      Instructions  albuterol  sulfate HFA 108 (90 Base) MCG/ACT inhaler Commonly known as: PROVENTIL ,VENTOLIN ,PROAIR   2 puffs, Inhalation, Every 6 hours as  needed, INHALE 2 PUFFS INTO THE LUNGS EVERY 4 HOURS AS NEEDED FOR COUGH   atomoxetine 40 mg capsule Commonly known as: STRATTERA  40 mg, Oral, 2 times a day, (In addition to 60 mg in the morning)   buPROPion 75 mg tablet Commonly known as: WELLBUTRIN  75 mg, Oral, 2 times a day   citalopram 40 mg tablet Commonly known as: CELEXA  40 mg, Oral, Every evening   ibuprofen  600 mg tablet Commonly known as: ADVIL ,MOTRIN   600 mg, Every 6 hours as needed   lamoTRIgine 150 mg tablet Commonly known as: LAMICTAL  150 mg, Oral, Daily   lumateperone 42 mg Caps capsule Commonly known as: CAPLYTA  42 mg, Oral, Daily   QELBREE 200 MG Cp24 capsule Generic drug: viloxazine HCl ER  200 mg, Oral, Every morning        No orders of the defined types were placed in this encounter.   Risks, benefits, and alternatives of the medications and treatment plan prescribed today were discussed, and patient expressed understanding. Plan follow-up as discussed or as needed if any worsening symptoms or change in condition.    A yearly preventative health exam was recommended and current age based recommendations were discussed.   Subjective   Patient ID:  Debbie Rojas is a 38 y.o. (DOB 08-02-1986) female    Patient presents with  . Follow-up    Discuss pain     HPI:   History of Present Illness The patient presents for back pain, sleep issues, and iron deficiency.  She reports persistent back pain attributed to a pinched nerve, described as dull with intermittent sharp twinges, localized in the middle back and radiating down the sides. No activity or posture changes have been identified as triggers. She uses Biofreeze, [employer], lidocaine  patches, and tizanidine for relief, but with limited success. She has an upcoming Reiki session scheduled for 08/21/2024.  She discontinued ZzzQuil due to tolerance changes post-weight loss. She uses melatonin tincture but struggles to return to  sleep once awakened. Melatonin 10 mg aids sleep onset but not maintenance, while 15 mg causes morning fatigue.  She has a scheduled 6-week iron infusion checkup on 08/21/2024. She reports occasional dizziness upon standing and shortness of breath but overall feels well.    Past Medical History, Past Surgery History, Allergies, Social History, and Family History were reviewed and updated.    Review of Systems  Constitutional:  Negative for chills, fatigue and fever.  HENT: Negative.    Eyes: Negative.   Respiratory:  Negative for shortness of breath.   Cardiovascular:  Negative for chest pain.  Gastrointestinal:  Negative for diarrhea, nausea and vomiting.  Endocrine: Negative.   Musculoskeletal:  Positive for back pain.  Skin: Negative.   Neurological: Negative.   Psychiatric/Behavioral: Negative.       Objective   Vitals:   08/15/24 1105  BP: 128/76  Patient Position: Sitting  Pulse: 90  Resp: 16  Height: 5' 4.5 (1.638 m)  Weight: 157 lb 6.4 oz (71.4 kg)  SpO2: 96%  BMI (Calculated): 26.6  PainSc:   6  PainLoc: Back      Physical Exam Constitutional:      General: She is not in acute distress. Cardiovascular:     Rate and Rhythm: Normal rate and regular rhythm.     Heart sounds: Normal heart sounds.  Musculoskeletal:     Comments: Back pain upper back /neck  Pulmonary:     Effort: Pulmonary effort is normal.  Neurological:     Mental Status: She is alert and oriented to person, place, and time.  Psychiatric:        Behavior: Behavior normal.       Voice recognition software was used and creation of this note. Despite my best effort at editing the text, some misspelling/errors may have occurred. *Some images could not be shown.

## 2024-08-19 NOTE — Progress Notes (Signed)
 Patient had labs drawn today. Staff will call patient once all labs have resulted. Patient will also have access to labs via myChart.

## 2024-09-06 ENCOUNTER — Encounter (HOSPITAL_BASED_OUTPATIENT_CLINIC_OR_DEPARTMENT_OTHER): Payer: Self-pay

## 2024-09-06 ENCOUNTER — Other Ambulatory Visit: Payer: Self-pay

## 2024-09-06 ENCOUNTER — Emergency Department (HOSPITAL_BASED_OUTPATIENT_CLINIC_OR_DEPARTMENT_OTHER)
Admission: EM | Admit: 2024-09-06 | Discharge: 2024-09-06 | Disposition: A | Discharge status: Home / Self Care | Payer: MEDICAID | Attending: Emergency Medicine | Admitting: Emergency Medicine

## 2024-09-06 DIAGNOSIS — M542 Cervicalgia: Secondary | ICD-10-CM | POA: Diagnosis present

## 2024-09-06 DIAGNOSIS — M62838 Other muscle spasm: Secondary | ICD-10-CM | POA: Insufficient documentation

## 2024-09-06 DIAGNOSIS — M5412 Radiculopathy, cervical region: Secondary | ICD-10-CM | POA: Diagnosis not present

## 2024-09-06 MED ORDER — PREDNISONE 10 MG (21) PO TBPK: ORAL_TABLET | Freq: Every day | ORAL | 0 refills | Status: AC

## 2024-09-06 NOTE — ED Triage Notes (Signed)
 Pt reports that she is having some back pain and it radiates to right shoulder. States that the pain feels like electrical jolts. States that pain has been building. Has a pinched nerve.
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
## 2024-09-06 NOTE — Discharge Instructions (Signed)
 Limited use of right arm sling. Remove arm from sling frequently throughout the day for gentle range of motion exercises as discussed. See additional exercises in paperwork.  Prednisone  taper as prescribed, start today and complete the full course. You can continue with your tizanidine. Apply a warm compress to your shoulder area for 20 minutes prior to doing your shoulder stretches.  Follow-up with your primary care provider, you would likely benefit from referral to physical therapy.
